# Patient Record
Sex: Female | Born: 1940 | Race: White | Hispanic: No | Marital: Married | Smoking: Never smoker
Health system: Southern US, Community
[De-identification: ages and names within clinical notes are randomized; demographics above are authoritative.]

---

## 2000-05-31 ENCOUNTER — Other Ambulatory Visit: Admission: RE | Admit: 2000-05-31 | Discharge: 2000-05-31 | Payer: Self-pay | Admitting: *Deleted

## 2001-09-20 ENCOUNTER — Encounter: Payer: Self-pay | Admitting: Family Medicine

## 2001-09-20 ENCOUNTER — Ambulatory Visit (HOSPITAL_COMMUNITY): Admission: RE | Admit: 2001-09-20 | Discharge: 2001-09-20 | Payer: Self-pay | Admitting: Family Medicine

## 2004-01-06 ENCOUNTER — Ambulatory Visit (HOSPITAL_COMMUNITY): Admission: RE | Admit: 2004-01-06 | Discharge: 2004-01-06 | Payer: Self-pay | Admitting: Family Medicine

## 2004-01-07 ENCOUNTER — Ambulatory Visit (HOSPITAL_COMMUNITY): Admission: RE | Admit: 2004-01-07 | Discharge: 2004-01-07 | Payer: Self-pay | Admitting: Family Medicine

## 2004-03-11 ENCOUNTER — Ambulatory Visit: Payer: Self-pay | Admitting: Gastroenterology

## 2006-05-09 ENCOUNTER — Ambulatory Visit (HOSPITAL_COMMUNITY): Admission: RE | Admit: 2006-05-09 | Discharge: 2006-05-09 | Payer: Self-pay | Admitting: Family Medicine

## 2006-11-14 ENCOUNTER — Ambulatory Visit (HOSPITAL_COMMUNITY): Admission: RE | Admit: 2006-11-14 | Discharge: 2006-11-14 | Payer: Self-pay | Admitting: Family Medicine

## 2006-11-22 ENCOUNTER — Ambulatory Visit (HOSPITAL_COMMUNITY): Admission: RE | Admit: 2006-11-22 | Discharge: 2006-11-22 | Payer: Self-pay | Admitting: Family Medicine

## 2007-12-05 ENCOUNTER — Ambulatory Visit (HOSPITAL_COMMUNITY): Admission: RE | Admit: 2007-12-05 | Discharge: 2007-12-05 | Payer: Self-pay | Admitting: Family Medicine

## 2008-03-03 ENCOUNTER — Ambulatory Visit (HOSPITAL_COMMUNITY): Admission: RE | Admit: 2008-03-03 | Discharge: 2008-03-03 | Payer: Self-pay | Admitting: Family Medicine

## 2008-03-10 ENCOUNTER — Ambulatory Visit (HOSPITAL_COMMUNITY): Admission: RE | Admit: 2008-03-10 | Discharge: 2008-03-10 | Payer: Self-pay | Admitting: Family Medicine

## 2010-02-24 IMAGING — CT CT HEAD W/O CM
1 series · 16 of 30 positions shown, 20 images · non-contrast
Comparison: Head CT 05/09/2006

CLINICAL DATA: Acute headache, worst of life.

CT HEAD WITHOUT CONTRAST
TECHNIQUE: Contiguous axial images were obtained from the base of
the skull through the vertex without contrast.

[Series 2: headseq 4.8 h37s · axial · 0.43mm/px · z∈[+124,+257]mm · 16 of 31 slices shown, 20 images]
[im 2/31  brain]
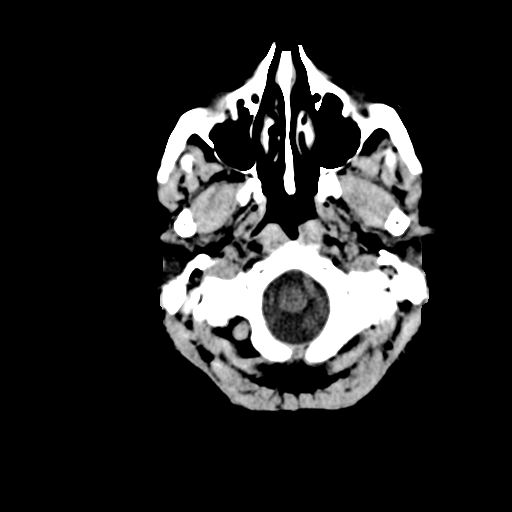
[im 2/31  bone]
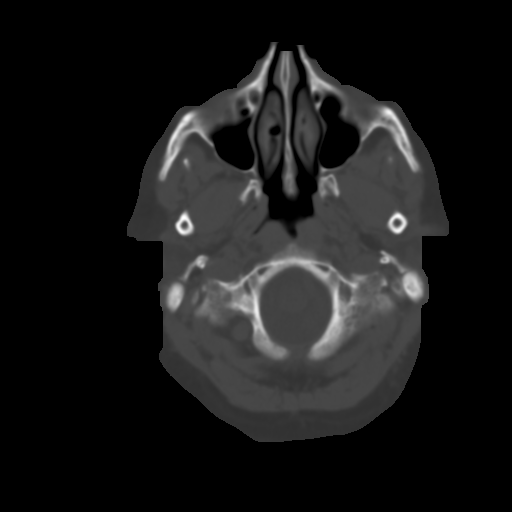
[im 4/31  brain]
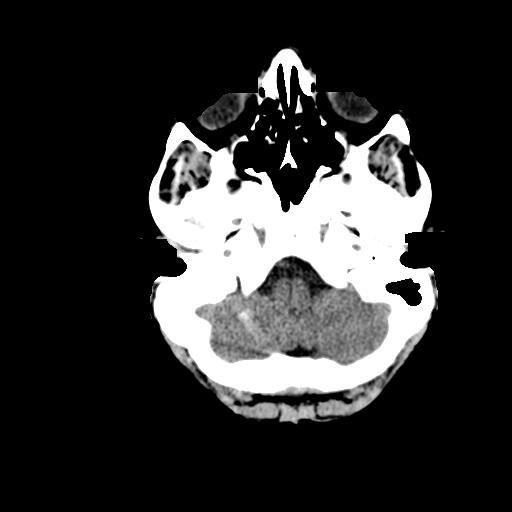
[im 6/31  brain]
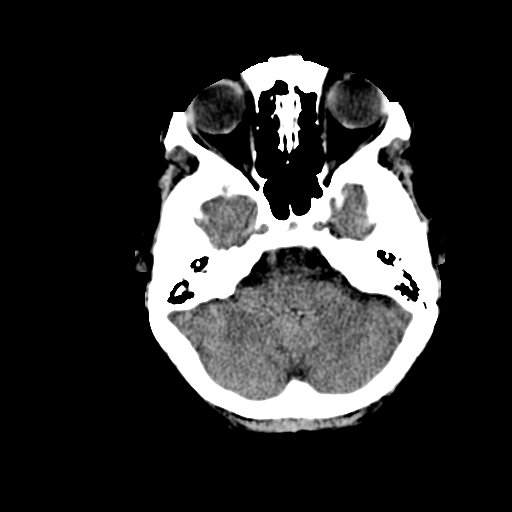
[im 8/31  brain]
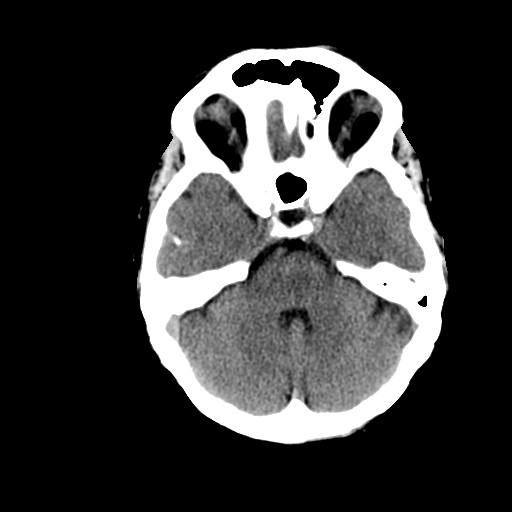
[im 9/31  brain]
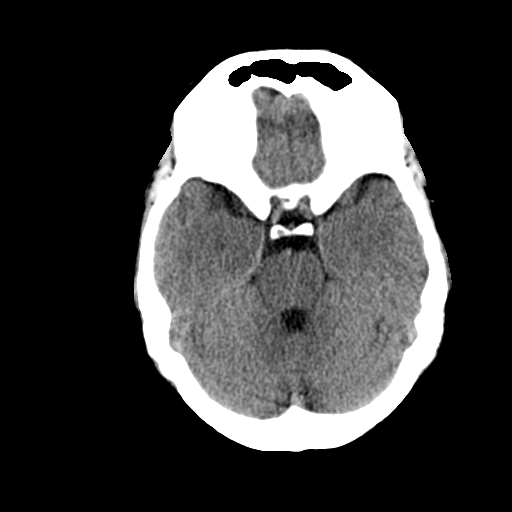
[im 9/31  bone]
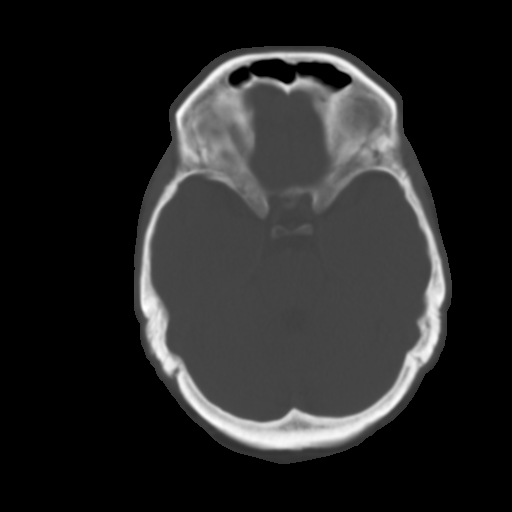
[im 11/31  brain]
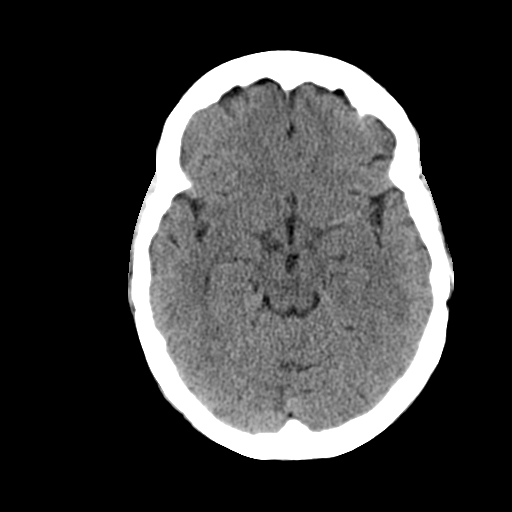
[im 13/31  brain]
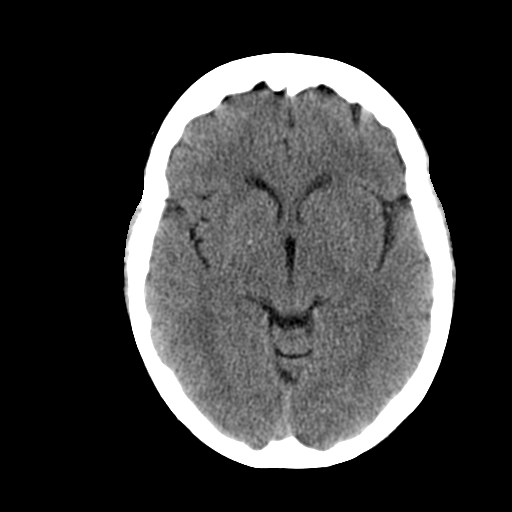
[im 15/31  brain]
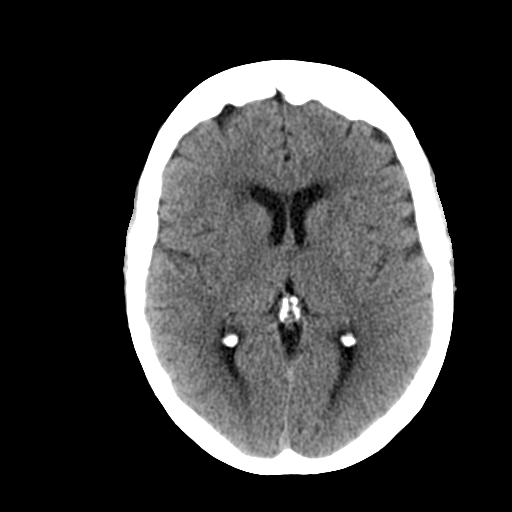
[im 16/31  brain]
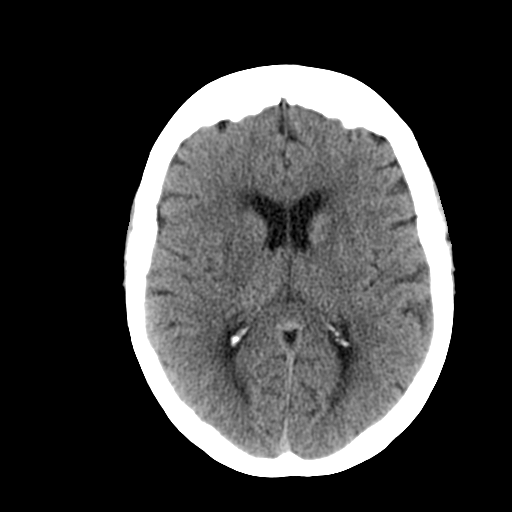
[im 16/31  bone]
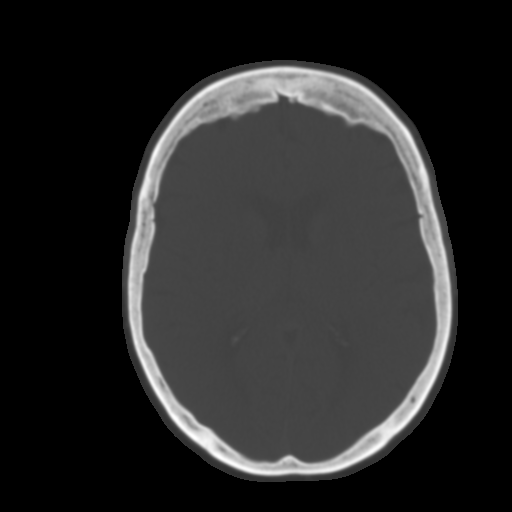
[im 18/31  brain]
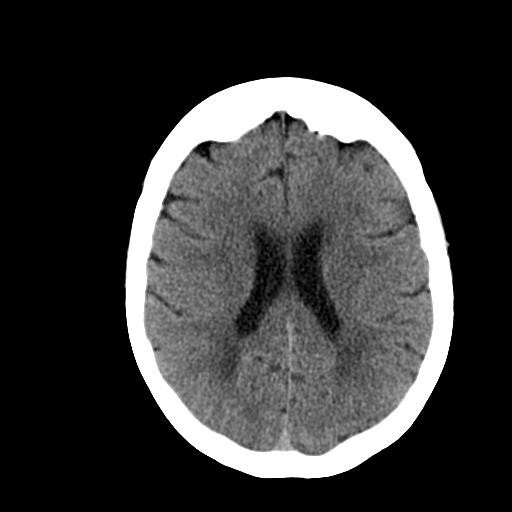
[im 20/31  brain]
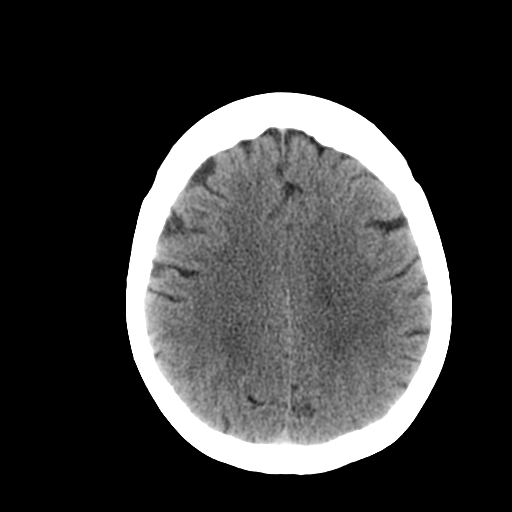
[im 22/31  brain]
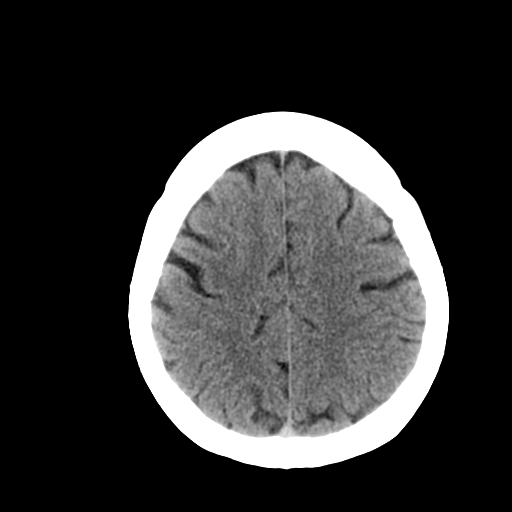
[im 23/31  brain]
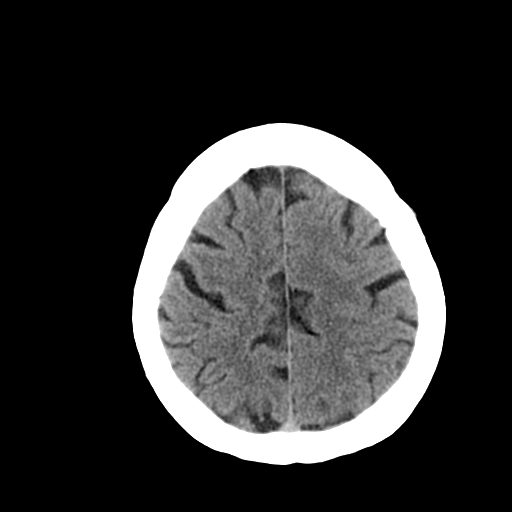
[im 23/31  bone]
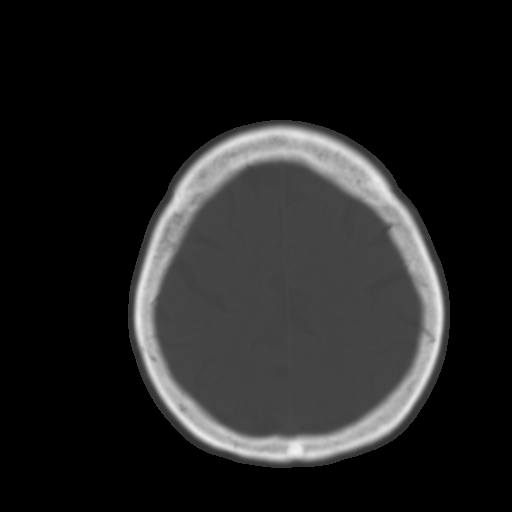
[im 25/31  brain]
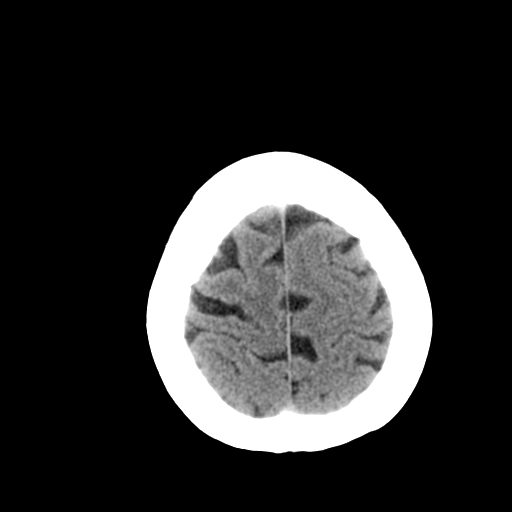
[im 27/31  brain]
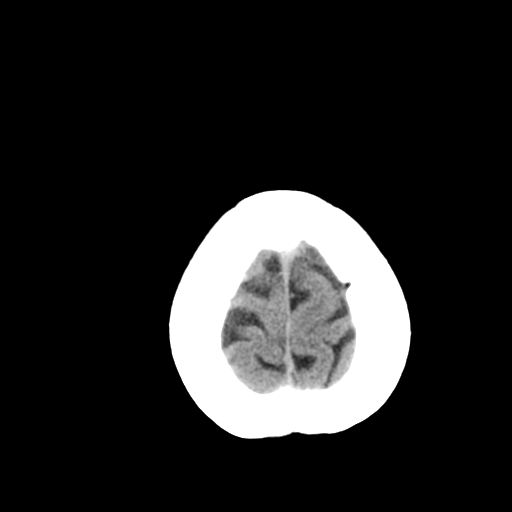
[im 29/31  brain]
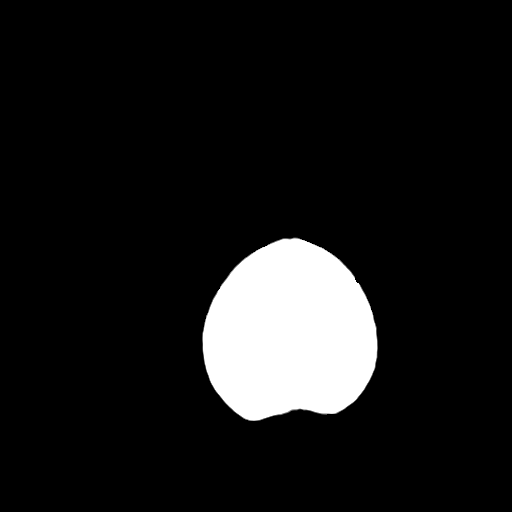

[16 of 30 positions shown; findings below may reference images not displayed]

FINDINGS: No evidence of acute intracranial hemorrhage.  No
hydrocephalus, midline shift or mass effect.  No focal mass lesion.
There is a new subcortical hypodensity in the left frontal lobe
(image 21).  No CT evidence of acute cortical infarction.  There is
mild periventricular and subcortical white matter disease similar
to prior.

Paranasal sinuses mastoid air cells are clear.  Orbits are normal.
IMPRESSION: 1. No evidence of acute intracranial hemorrhage.
2.  New white matter hypodensity in the left frontal lobe.  This
represents an age indeterminate infarction.  No CT evidence of
cortical infarction.  Consider MRI for further evaluation if
concern for acute stroke.

## 2010-10-12 ENCOUNTER — Other Ambulatory Visit (HOSPITAL_COMMUNITY): Payer: Self-pay | Admitting: Internal Medicine

## 2010-10-12 DIAGNOSIS — Z139 Encounter for screening, unspecified: Secondary | ICD-10-CM

## 2010-12-16 ENCOUNTER — Ambulatory Visit (HOSPITAL_COMMUNITY)
Admission: RE | Admit: 2010-12-16 | Discharge: 2010-12-16 | Disposition: A | Discharge status: Home / Self Care | Payer: Medicare Other | Source: Ambulatory Visit | Attending: Internal Medicine | Admitting: Internal Medicine

## 2010-12-16 DIAGNOSIS — Z1231 Encounter for screening mammogram for malignant neoplasm of breast: Secondary | ICD-10-CM | POA: Insufficient documentation

## 2010-12-16 DIAGNOSIS — Z780 Asymptomatic menopausal state: Secondary | ICD-10-CM | POA: Insufficient documentation

## 2010-12-16 DIAGNOSIS — Z139 Encounter for screening, unspecified: Secondary | ICD-10-CM

## 2010-12-16 DIAGNOSIS — M899 Disorder of bone, unspecified: Secondary | ICD-10-CM | POA: Insufficient documentation

## 2012-07-11 ENCOUNTER — Other Ambulatory Visit (HOSPITAL_COMMUNITY): Payer: Self-pay | Admitting: Family Medicine

## 2012-07-11 DIAGNOSIS — Z139 Encounter for screening, unspecified: Secondary | ICD-10-CM

## 2012-07-16 ENCOUNTER — Ambulatory Visit (HOSPITAL_COMMUNITY)
Admission: RE | Admit: 2012-07-16 | Discharge: 2012-07-16 | Disposition: A | Discharge status: Home / Self Care | Payer: Medicare Other | Source: Ambulatory Visit | Attending: Family Medicine | Admitting: Family Medicine

## 2012-07-16 DIAGNOSIS — Z139 Encounter for screening, unspecified: Secondary | ICD-10-CM

## 2012-07-16 DIAGNOSIS — Z1231 Encounter for screening mammogram for malignant neoplasm of breast: Secondary | ICD-10-CM | POA: Insufficient documentation

## 2013-05-03 ENCOUNTER — Ambulatory Visit (INDEPENDENT_AMBULATORY_CARE_PROVIDER_SITE_OTHER): Payer: Commercial Managed Care - HMO | Admitting: Podiatry

## 2013-05-03 ENCOUNTER — Encounter: Payer: Self-pay | Admitting: Podiatry

## 2013-05-03 ENCOUNTER — Ambulatory Visit (INDEPENDENT_AMBULATORY_CARE_PROVIDER_SITE_OTHER): Payer: Commercial Managed Care - HMO

## 2013-05-03 VITALS — BP 124/68 | HR 64 | Resp 16 | Ht 65.0 in | Wt 161.0 lb

## 2013-05-03 DIAGNOSIS — M2040 Other hammer toe(s) (acquired), unspecified foot: Secondary | ICD-10-CM

## 2013-05-03 NOTE — Progress Notes (Signed)
Have a hammertoe on the right foot driving me crazy. 3rd digit right foot

## 2013-05-03 NOTE — Progress Notes (Signed)
Subjective:     Patient ID: Cassandra Carroll, female   DOB: 01-29-1941, 73 y.o.   MRN: [id number]  HPI patient states that she has a very painful hammertoe deformity third right that has been present for about a year and getting gradually worse. She's tried trimming padding shoe gear modifications without relief of symptoms and we had fixed her fourth and fifth toes several years ago and they have done wonderful   Review of Systems     Objective:   Physical Exam Neurovascular status was found to be intact with a very painful distal keratotic lesion digit 3 right and an elevated third toe at the proximal interphalangeal joint causing distal plantar flexion of the toe    Assessment:     Significant hammertoe deformity third right with a distal keratotic lesion deformity that is very painful when pressed with patient having inability to walk comfortably or wear shoe gear    Plan:     Discussed deformity at this time. She has tried trimming padding and shoe gear modifications and desires correction area I have recommended proximal arthroplasty with possible fusion and distal elevating arthroplasty with pin fixation. I explained to her the surgery and at this time allowed her to read a consent form concerning correction. Explained all possible complications that can occur and the fact there is no long-term guarantees as far as success of surgery and that eventually the second toe and big toe may need work because of its pressure that puts against the third toe. Patient understands this and that recovery is about 6 months and is scheduled for outpatient surgery

## 2013-05-10 ENCOUNTER — Ambulatory Visit: Payer: Self-pay | Admitting: Podiatry

## 2013-05-24 ENCOUNTER — Encounter: Payer: Self-pay | Admitting: Podiatry

## 2013-05-24 NOTE — Progress Notes (Signed)
PRECERT# [id number] FOR SURGERY [facility] ON 4.14.15. EXPIRES 7.13.15.

## 2013-05-28 ENCOUNTER — Encounter: Payer: Self-pay | Admitting: Podiatry

## 2013-05-28 DIAGNOSIS — M2040 Other hammer toe(s) (acquired), unspecified foot: Secondary | ICD-10-CM

## 2013-05-31 NOTE — Progress Notes (Signed)
1. Hammer toe repair with removal bone prox and distal 3rd toe (r) with pin

## 2013-06-04 ENCOUNTER — Ambulatory Visit: Payer: Commercial Managed Care - HMO | Admitting: Podiatry

## 2013-06-04 ENCOUNTER — Ambulatory Visit (INDEPENDENT_AMBULATORY_CARE_PROVIDER_SITE_OTHER): Payer: Commercial Managed Care - HMO

## 2013-06-04 ENCOUNTER — Encounter: Payer: Self-pay | Admitting: Podiatry

## 2013-06-04 VITALS — BP 134/74 | HR 62 | Resp 12

## 2013-06-04 DIAGNOSIS — Z9889 Other specified postprocedural states: Secondary | ICD-10-CM

## 2013-06-04 DIAGNOSIS — M79609 Pain in unspecified limb: Secondary | ICD-10-CM

## 2013-06-04 DIAGNOSIS — M2040 Other hammer toe(s) (acquired), unspecified foot: Secondary | ICD-10-CM

## 2013-06-04 NOTE — Progress Notes (Signed)
Subjective:     Patient ID: Cassandra Carroll, female   DOB: [birth year], 73 y.o.   MRN: [id number]  HPI patient presents stating my right foot is doing very well with pain intact and the toe in good alignment one week after surgery   Review of Systems     Objective:   Physical Exam Neurovascular status unchanged with digital position third toe very good with pain intact and wound edges well coapted with sutures intact    Assessment:     Doing very well postoperatively digital procedure right third toe    Plan:     X-ray reviewed and sterile dressing reapplied along with digital splint. Reappoint her recheck

## 2013-06-05 NOTE — Progress Notes (Signed)
Rx:  vicodin 5/325 #20 0 refills one by mouth every 6 hrs as needed for pain.

## 2013-06-14 ENCOUNTER — Ambulatory Visit (INDEPENDENT_AMBULATORY_CARE_PROVIDER_SITE_OTHER): Payer: Commercial Managed Care - HMO | Admitting: *Deleted

## 2013-06-14 VITALS — BP 122/66 | HR 62 | Resp 16

## 2013-06-14 DIAGNOSIS — Z9889 Other specified postprocedural states: Secondary | ICD-10-CM

## 2013-06-14 NOTE — Progress Notes (Signed)
Pt presents for suture removal , sutures looked good, removed sutures and to follow up with dr regal 2 weeks

## 2013-07-02 ENCOUNTER — Ambulatory Visit (INDEPENDENT_AMBULATORY_CARE_PROVIDER_SITE_OTHER): Payer: Commercial Managed Care - HMO

## 2013-07-02 ENCOUNTER — Encounter: Payer: Self-pay | Admitting: Podiatry

## 2013-07-02 ENCOUNTER — Ambulatory Visit (INDEPENDENT_AMBULATORY_CARE_PROVIDER_SITE_OTHER): Payer: Commercial Managed Care - HMO | Admitting: Podiatry

## 2013-07-02 VITALS — BP 122/66 | HR 62 | Resp 16

## 2013-07-02 DIAGNOSIS — M2040 Other hammer toe(s) (acquired), unspecified foot: Secondary | ICD-10-CM

## 2013-07-02 NOTE — Progress Notes (Signed)
Subjective:     Patient ID: Cassandra Carroll, female   DOB: 1940/05/24, 73 y.o.   MRN: [id number]  HPI patient states I'm doing very well with my toe   Review of Systems     Objective:   Physical Exam Neurovascular status intact pin is intact second digit right and the toe is in good alignment with mild edema    Assessment:     Healing well from digital fusion digit for right    Plan:     Reviewed x-rays removed pin and applied sterile dressing. Begin slow return to saw shoe gear in reappoint as needed

## 2014-03-10 ENCOUNTER — Ambulatory Visit: Payer: Self-pay | Admitting: Gastroenterology

## 2014-03-10 DIAGNOSIS — Z800 Family history of malignant neoplasm of digestive organs: Secondary | ICD-10-CM | POA: Diagnosis not present

## 2014-03-10 DIAGNOSIS — Z1211 Encounter for screening for malignant neoplasm of colon: Secondary | ICD-10-CM | POA: Diagnosis not present

## 2014-03-10 DIAGNOSIS — E079 Disorder of thyroid, unspecified: Secondary | ICD-10-CM | POA: Diagnosis not present

## 2014-10-27 DIAGNOSIS — H5210 Myopia, unspecified eye: Secondary | ICD-10-CM | POA: Diagnosis not present

## 2014-10-27 DIAGNOSIS — I10 Essential (primary) hypertension: Secondary | ICD-10-CM | POA: Diagnosis not present

## 2014-10-27 DIAGNOSIS — H2513 Age-related nuclear cataract, bilateral: Secondary | ICD-10-CM | POA: Diagnosis not present

## 2014-10-27 DIAGNOSIS — H5200 Hypermetropia, unspecified eye: Secondary | ICD-10-CM | POA: Diagnosis not present

## 2014-11-21 DIAGNOSIS — Z1389 Encounter for screening for other disorder: Secondary | ICD-10-CM | POA: Diagnosis not present

## 2014-11-21 DIAGNOSIS — Z23 Encounter for immunization: Secondary | ICD-10-CM | POA: Diagnosis not present

## 2014-11-21 DIAGNOSIS — E663 Overweight: Secondary | ICD-10-CM | POA: Diagnosis not present

## 2014-11-21 DIAGNOSIS — Z0001 Encounter for general adult medical examination with abnormal findings: Secondary | ICD-10-CM | POA: Diagnosis not present

## 2014-11-21 DIAGNOSIS — F329 Major depressive disorder, single episode, unspecified: Secondary | ICD-10-CM | POA: Diagnosis not present

## 2014-11-21 DIAGNOSIS — Z6825 Body mass index (BMI) 25.0-25.9, adult: Secondary | ICD-10-CM | POA: Diagnosis not present

## 2014-11-21 DIAGNOSIS — I10 Essential (primary) hypertension: Secondary | ICD-10-CM | POA: Diagnosis not present

## 2014-11-21 DIAGNOSIS — E063 Autoimmune thyroiditis: Secondary | ICD-10-CM | POA: Diagnosis not present

## 2015-06-29 DIAGNOSIS — E669 Obesity, unspecified: Secondary | ICD-10-CM | POA: Diagnosis not present

## 2015-06-29 DIAGNOSIS — E782 Mixed hyperlipidemia: Secondary | ICD-10-CM | POA: Diagnosis not present

## 2015-06-29 DIAGNOSIS — Z1389 Encounter for screening for other disorder: Secondary | ICD-10-CM | POA: Diagnosis not present

## 2015-06-29 DIAGNOSIS — M20011 Mallet finger of right finger(s): Secondary | ICD-10-CM | POA: Diagnosis not present

## 2015-06-29 DIAGNOSIS — Z6825 Body mass index (BMI) 25.0-25.9, adult: Secondary | ICD-10-CM | POA: Diagnosis not present

## 2015-06-29 DIAGNOSIS — E663 Overweight: Secondary | ICD-10-CM | POA: Diagnosis not present

## 2015-06-29 DIAGNOSIS — I10 Essential (primary) hypertension: Secondary | ICD-10-CM | POA: Diagnosis not present

## 2015-07-08 DIAGNOSIS — M20011 Mallet finger of right finger(s): Secondary | ICD-10-CM | POA: Diagnosis not present

## 2015-07-24 DIAGNOSIS — M20011 Mallet finger of right finger(s): Secondary | ICD-10-CM | POA: Diagnosis not present

## 2015-08-10 DIAGNOSIS — M20011 Mallet finger of right finger(s): Secondary | ICD-10-CM | POA: Diagnosis not present

## 2015-09-02 DIAGNOSIS — M20011 Mallet finger of right finger(s): Secondary | ICD-10-CM | POA: Diagnosis not present

## 2015-09-22 DIAGNOSIS — M20011 Mallet finger of right finger(s): Secondary | ICD-10-CM | POA: Diagnosis not present

## 2015-10-02 DIAGNOSIS — M20011 Mallet finger of right finger(s): Secondary | ICD-10-CM | POA: Diagnosis not present

## 2015-10-16 DIAGNOSIS — M20011 Mallet finger of right finger(s): Secondary | ICD-10-CM | POA: Diagnosis not present

## 2015-10-26 DIAGNOSIS — M20011 Mallet finger of right finger(s): Secondary | ICD-10-CM | POA: Diagnosis not present

## 2015-11-04 DIAGNOSIS — M20011 Mallet finger of right finger(s): Secondary | ICD-10-CM | POA: Diagnosis not present

## 2015-11-09 DIAGNOSIS — M20011 Mallet finger of right finger(s): Secondary | ICD-10-CM | POA: Diagnosis not present

## 2015-11-23 DIAGNOSIS — Z0000 Encounter for general adult medical examination without abnormal findings: Secondary | ICD-10-CM | POA: Diagnosis not present

## 2015-11-23 DIAGNOSIS — E663 Overweight: Secondary | ICD-10-CM | POA: Diagnosis not present

## 2015-11-23 DIAGNOSIS — Z1389 Encounter for screening for other disorder: Secondary | ICD-10-CM | POA: Diagnosis not present

## 2015-11-23 DIAGNOSIS — I10 Essential (primary) hypertension: Secondary | ICD-10-CM | POA: Diagnosis not present

## 2015-11-23 DIAGNOSIS — M20011 Mallet finger of right finger(s): Secondary | ICD-10-CM | POA: Diagnosis not present

## 2015-11-23 DIAGNOSIS — E039 Hypothyroidism, unspecified: Secondary | ICD-10-CM | POA: Diagnosis not present

## 2015-11-23 DIAGNOSIS — E782 Mixed hyperlipidemia: Secondary | ICD-10-CM | POA: Diagnosis not present

## 2015-11-23 DIAGNOSIS — Z6827 Body mass index (BMI) 27.0-27.9, adult: Secondary | ICD-10-CM | POA: Diagnosis not present

## 2015-12-07 DIAGNOSIS — M20011 Mallet finger of right finger(s): Secondary | ICD-10-CM | POA: Diagnosis not present

## 2015-12-21 DIAGNOSIS — M20011 Mallet finger of right finger(s): Secondary | ICD-10-CM | POA: Diagnosis not present

## 2016-01-04 DIAGNOSIS — M20011 Mallet finger of right finger(s): Secondary | ICD-10-CM | POA: Diagnosis not present

## 2016-02-01 DIAGNOSIS — M20011 Mallet finger of right finger(s): Secondary | ICD-10-CM | POA: Diagnosis not present

## 2016-02-18 ENCOUNTER — Ambulatory Visit: Payer: Commercial Managed Care - HMO | Admitting: Podiatry

## 2016-03-10 ENCOUNTER — Ambulatory Visit: Payer: Commercial Managed Care - HMO | Admitting: Podiatry

## 2016-03-24 ENCOUNTER — Ambulatory Visit: Payer: Commercial Managed Care - HMO | Admitting: Podiatry

## 2016-03-24 ENCOUNTER — Ambulatory Visit (INDEPENDENT_AMBULATORY_CARE_PROVIDER_SITE_OTHER): Payer: Medicare HMO | Admitting: Podiatry

## 2016-03-24 ENCOUNTER — Ambulatory Visit (INDEPENDENT_AMBULATORY_CARE_PROVIDER_SITE_OTHER): Payer: Medicare HMO

## 2016-03-24 DIAGNOSIS — M2041 Other hammer toe(s) (acquired), right foot: Secondary | ICD-10-CM

## 2016-03-24 NOTE — Patient Instructions (Signed)
Bunionectomy A bunionectomy is a surgical procedure to remove a bunion. A bunion is a visible bump of bone on the inside of your foot where your big toe meets the rest of your foot. A bunion can develop when pressure turns this bone (first metatarsal) toward the other toes. Shoes that are too tight are the most common cause of bunions. Bunions can also be caused by diseases, such as arthritis and polio. You may need a bunionectomy if your bunion is very large and painful or it affects your ability to walk. Tell a health care provider about:  Any allergies you have.  All medicines you are taking, including vitamins, herbs, eye drops, creams, and over-the-counter medicines.  Any problems you or family members have had with anesthetic medicines.  Any blood disorders you have.  Any surgeries you have had.  Any medical conditions you have. What are the risks? Generally, this is a safe procedure. However, problems may occur, including:  Infection.  Pain.  Nerve damage.  Bleeding or blood clots.  Reactions to medicines.  Numbness, stiffness, or arthritis in your toe.  Foot problems that continue even after the procedure. What happens before the procedure?  Ask your health care provider about:  Changing or stopping your regular medicines. This is especially important if you are taking diabetes medicines or blood thinners.  Taking medicines such as aspirin and ibuprofen. These medicines can thin your blood. Do not take these medicines before your procedure if your health care provider instructs you not to.  Do not drink alcohol before the procedure as directed by your health care provider.  Do not use tobacco products, including cigarettes, chewing tobacco, or electronic cigarettes, before the procedure as directed by your health care provider. If you need help quitting, ask your health care provider.  Ask your health care provider what kind of medicine you will be given during  your procedure. A bunionectomy may be done using one of these:  A medicine that numbs the area (local anesthetic).  A medicine that makes you go to sleep (general anesthetic). If you will be given general anesthetic, do not eat or drink anything after midnight on the night before the procedure or as directed by your health care provider. What happens during the procedure?  An IV tube may be inserted into a vein.  You will be given local anesthetic or general anesthetic.  The surgeon will make a cut (incision) over the enlarged area at the first joint of the big toe. The surgeon will remove the bunion.  You may have more than one incision if any of the bones in your big toe need to be moved. A bone itself may need to be cut.  Sometimes the tissues around the big toe may also need to be cut then tightened or loosened to reposition the toe.  Screws or other hardware may be used to keep your foot in thecorrect position.  The incision will be closed with stitches (sutures) and covered with adhesive strips or another type of bandage (dressing). What happens after the procedure?  You may spend some time in a recovery area.  Your blood pressure, heart rate, breathing rate, and blood oxygen level will be monitored often until the medicines you were given have worn off. This information is not intended to replace advice given to you by your health care provider. Make sure you discuss any questions you have with your health care provider. Document Released: 01/14/2005 Document Revised: 07/09/2015 Document Reviewed: 09/18/2013   Elsevier Interactive Patient Education  2017 [place] Toe Hammer toe is a change in the shape (a deformity) of your second, third, or fourth toe. The deformity causes the middle joint of your toe to stay bent. This causes pain, especially when you are wearing shoes. Hammer toe starts gradually. At first, the toe can be straightened. Gradually over time, the  deformity becomes stiff and permanent. Early treatments to keep the toe straight may relieve pain. As the deformity becomes stiff and permanent, surgery may be needed to straighten the toe. What are the causes? Hammer toe is caused by abnormal bending of the toe joint that is closest to your foot. It happens gradually over time. This pulls on the muscles and connections (tendons) of the toe joint, making them weak and stiff. It is often related to wearing shoes that are too short or narrow and do not let your toes straighten. What increases the risk? You may be at greater risk for hammer toe if you:  Are female.  Are older.  Wear shoes that are too small.  Wear high-heeled shoes that pinch your toes.  Are a Engineer, mining.  Have a second toe that is longer than your big toe (first toe).  Injure your foot or toe.  Have arthritis.  Have a family history of hammer toe.  Have a nerve or muscle disorder. What are the signs or symptoms? The main symptoms of this condition are pain and deformity of the toe. The pain is worse when wearing shoes, walking, or running. Other symptoms may include:  Corns or calluses over the bent part of the toe or between the toes.  Redness and a burning feeling on the toe.  An open sore that forms on the top of the toe.  Not being able to straighten the toe. How is this diagnosed? This condition is diagnosed based on your symptoms and a physical exam. During the exam, your health care provider will try to straighten your toe to see how stiff the deformity is. You may also have tests, such as:  A blood test to check for rheumatoid arthritis.  An X-ray to show how severe the deformity is. How is this treated? Treatment for this condition will depend on how stiff the deformity is. Surgery is often needed. However, sometimes a hammer toe can be straightened without surgery. Treatments that do not involve surgery include:  Taping the toe into a  straightened position.  Using pads and cushions to protect the toe (orthotics).  Wearing shoes that provide enough room for the toes.  Doing toe-stretching exercises at home.  Taking an NSAID to reduce pain and swelling. If these treatments do not help or the toe cannot be straightened, surgery is the next option. The most common surgeries used to straighten a hammer toe include:  Arthroplasty. In this procedure, part of the joint is removed, and that allows the toe to straighten.  Fusion. In this procedure, cartilage between the two bones of the joint is taken out and the bones are fused together into one longer bone.  Implantation. In this procedure, part of the bone is removed and replaced with an implant to let the toe move again.  Flexor tendon transfer. In this procedure, the tendons that curl the toes down (flexor tendons) are repositioned. Follow these instructions at home:  Take over-the-counter and prescription medicines only as told by your health care provider.  Do toe straightening and stretching exercises as told by your  health care provider.  Keep all follow-up visits as told by your health care provider. This is important. How is this prevented?  Wear shoes that give your toes enough room and do not cause pain.  Do not wear high-heeled shoes. Contact a health care provider if:  Your pain gets worse.  Your toe becomes red or swollen.  You develop an open sore on your toe. This information is not intended to replace advice given to you by your health care provider. Make sure you discuss any questions you have with your health care provider. Document Released: 01/29/2000 Document Revised: 08/21/2015 Document Reviewed: 05/27/2015 Elsevier Interactive Patient Education  2017 [employer].

## 2016-03-29 DIAGNOSIS — M2041 Other hammer toe(s) (acquired), right foot: Secondary | ICD-10-CM | POA: Insufficient documentation

## 2016-03-29 NOTE — Progress Notes (Signed)
Subjective: 76 year old female presents the office today for concerns of a hammertoe of the right second toe which has become painful to walk. She has tried changing her shoes as well as padding without any significant improvement. She would discuss surgical intervention. Denies any systemic complaints such as fevers, chills, nausea, vomiting. No acute changes since last appointment, and no other complaints at this time.   Objective: AAO x3, NAD DP/PT pulses palpable bilaterally, CRT less than 3 seconds Hammertoe contractures present the right second toe which is semirigid. There is also flexion contracture the DIPJ. There is tenderness along the right second toe on the PIPJ. There is no other areas of tenderness. There is no edema, erythema, increase in warmth. No open lesions or pre-ulcerative lesions.  No pain with calf compression, swelling, warmth, erythema  Assessment: Hammertoe right 2nd toe.   Plan: -All treatment options discussed with the patient including all alternatives, risks, complications.  -X-rays were obtained and reviewed with the patient. Hammertoe is present with the majority of contracture to the right 2nd DIPJ.  -Etiology of symptoms were discussed -I discussed both conservative and surgical treatment options with the patient. At this time she would like surgery. I believe she wound benefit more from a PIPJ arthrodesis and DIPJ arthroplasty with k-wire fixation. She would like to undergo the surgery but not sure of timing. She would likely do it this spring. She will discuss with her husband and she will call to schedule. I will see her back 1-2 weeks prior to surgery for consent.  -Patient encouraged to call the office with any questions, concerns, change in symptoms.   Celesta Gentile, DPM

## 2016-04-05 DIAGNOSIS — J0100 Acute maxillary sinusitis, unspecified: Secondary | ICD-10-CM | POA: Diagnosis not present

## 2016-05-02 DIAGNOSIS — J0100 Acute maxillary sinusitis, unspecified: Secondary | ICD-10-CM | POA: Diagnosis not present

## 2016-05-23 ENCOUNTER — Other Ambulatory Visit (HOSPITAL_COMMUNITY): Payer: Self-pay | Admitting: Family Medicine

## 2016-05-23 DIAGNOSIS — Z1231 Encounter for screening mammogram for malignant neoplasm of breast: Secondary | ICD-10-CM

## 2016-06-03 ENCOUNTER — Ambulatory Visit (HOSPITAL_COMMUNITY): Payer: Self-pay

## 2016-06-06 ENCOUNTER — Ambulatory Visit (HOSPITAL_COMMUNITY)
Admission: RE | Admit: 2016-06-06 | Discharge: 2016-06-06 | Disposition: A | Discharge status: Home / Self Care | Payer: Medicare HMO | Source: Ambulatory Visit | Attending: Family Medicine | Admitting: Family Medicine

## 2016-06-06 DIAGNOSIS — Z1231 Encounter for screening mammogram for malignant neoplasm of breast: Secondary | ICD-10-CM | POA: Diagnosis not present

## 2016-07-15 DIAGNOSIS — H524 Presbyopia: Secondary | ICD-10-CM | POA: Diagnosis not present

## 2016-07-15 DIAGNOSIS — H43393 Other vitreous opacities, bilateral: Secondary | ICD-10-CM | POA: Diagnosis not present

## 2016-07-15 DIAGNOSIS — H25013 Cortical age-related cataract, bilateral: Secondary | ICD-10-CM | POA: Diagnosis not present

## 2016-07-15 DIAGNOSIS — H2513 Age-related nuclear cataract, bilateral: Secondary | ICD-10-CM | POA: Diagnosis not present

## 2016-08-01 DIAGNOSIS — D229 Melanocytic nevi, unspecified: Secondary | ICD-10-CM | POA: Diagnosis not present

## 2016-08-01 DIAGNOSIS — D2261 Melanocytic nevi of right upper limb, including shoulder: Secondary | ICD-10-CM | POA: Diagnosis not present

## 2016-08-01 DIAGNOSIS — D492 Neoplasm of unspecified behavior of bone, soft tissue, and skin: Secondary | ICD-10-CM | POA: Diagnosis not present

## 2016-08-01 DIAGNOSIS — L821 Other seborrheic keratosis: Secondary | ICD-10-CM | POA: Diagnosis not present

## 2017-01-12 DIAGNOSIS — Z0001 Encounter for general adult medical examination with abnormal findings: Secondary | ICD-10-CM | POA: Diagnosis not present

## 2017-01-12 DIAGNOSIS — E063 Autoimmune thyroiditis: Secondary | ICD-10-CM | POA: Diagnosis not present

## 2017-01-12 DIAGNOSIS — I10 Essential (primary) hypertension: Secondary | ICD-10-CM | POA: Diagnosis not present

## 2017-01-12 DIAGNOSIS — R430 Anosmia: Secondary | ICD-10-CM | POA: Diagnosis not present

## 2017-01-12 DIAGNOSIS — Z6827 Body mass index (BMI) 27.0-27.9, adult: Secondary | ICD-10-CM | POA: Diagnosis not present

## 2017-01-12 DIAGNOSIS — E663 Overweight: Secondary | ICD-10-CM | POA: Diagnosis not present

## 2017-01-12 DIAGNOSIS — E782 Mixed hyperlipidemia: Secondary | ICD-10-CM | POA: Diagnosis not present

## 2017-01-12 DIAGNOSIS — Z1389 Encounter for screening for other disorder: Secondary | ICD-10-CM | POA: Diagnosis not present

## 2017-01-12 DIAGNOSIS — E039 Hypothyroidism, unspecified: Secondary | ICD-10-CM | POA: Diagnosis not present

## 2017-04-10 DIAGNOSIS — H5203 Hypermetropia, bilateral: Secondary | ICD-10-CM | POA: Diagnosis not present

## 2017-04-10 DIAGNOSIS — H2513 Age-related nuclear cataract, bilateral: Secondary | ICD-10-CM | POA: Diagnosis not present

## 2017-04-10 DIAGNOSIS — H52223 Regular astigmatism, bilateral: Secondary | ICD-10-CM | POA: Diagnosis not present

## 2017-04-10 DIAGNOSIS — H25013 Cortical age-related cataract, bilateral: Secondary | ICD-10-CM | POA: Diagnosis not present

## 2017-04-10 DIAGNOSIS — H524 Presbyopia: Secondary | ICD-10-CM | POA: Diagnosis not present

## 2017-05-12 DIAGNOSIS — Z6827 Body mass index (BMI) 27.0-27.9, adult: Secondary | ICD-10-CM | POA: Diagnosis not present

## 2017-05-12 DIAGNOSIS — E7849 Other hyperlipidemia: Secondary | ICD-10-CM | POA: Diagnosis not present

## 2017-05-12 DIAGNOSIS — Z1389 Encounter for screening for other disorder: Secondary | ICD-10-CM | POA: Diagnosis not present

## 2017-05-12 DIAGNOSIS — E663 Overweight: Secondary | ICD-10-CM | POA: Diagnosis not present

## 2017-05-12 DIAGNOSIS — E782 Mixed hyperlipidemia: Secondary | ICD-10-CM | POA: Diagnosis not present

## 2017-12-07 DIAGNOSIS — I10 Essential (primary) hypertension: Secondary | ICD-10-CM | POA: Diagnosis not present

## 2017-12-07 DIAGNOSIS — Z6825 Body mass index (BMI) 25.0-25.9, adult: Secondary | ICD-10-CM | POA: Diagnosis not present

## 2017-12-07 DIAGNOSIS — Z1389 Encounter for screening for other disorder: Secondary | ICD-10-CM | POA: Diagnosis not present

## 2017-12-07 DIAGNOSIS — F329 Major depressive disorder, single episode, unspecified: Secondary | ICD-10-CM | POA: Diagnosis not present

## 2017-12-07 DIAGNOSIS — E039 Hypothyroidism, unspecified: Secondary | ICD-10-CM | POA: Diagnosis not present

## 2017-12-07 DIAGNOSIS — E782 Mixed hyperlipidemia: Secondary | ICD-10-CM | POA: Diagnosis not present

## 2017-12-07 DIAGNOSIS — Z0001 Encounter for general adult medical examination with abnormal findings: Secondary | ICD-10-CM | POA: Diagnosis not present

## 2018-11-30 DIAGNOSIS — E7849 Other hyperlipidemia: Secondary | ICD-10-CM | POA: Diagnosis not present

## 2018-11-30 DIAGNOSIS — Z23 Encounter for immunization: Secondary | ICD-10-CM | POA: Diagnosis not present

## 2018-11-30 DIAGNOSIS — Z0001 Encounter for general adult medical examination with abnormal findings: Secondary | ICD-10-CM | POA: Diagnosis not present

## 2018-11-30 DIAGNOSIS — Z1389 Encounter for screening for other disorder: Secondary | ICD-10-CM | POA: Diagnosis not present

## 2018-11-30 DIAGNOSIS — E063 Autoimmune thyroiditis: Secondary | ICD-10-CM | POA: Diagnosis not present

## 2018-11-30 DIAGNOSIS — I10 Essential (primary) hypertension: Secondary | ICD-10-CM | POA: Diagnosis not present

## 2018-11-30 DIAGNOSIS — E663 Overweight: Secondary | ICD-10-CM | POA: Diagnosis not present

## 2018-11-30 DIAGNOSIS — Z6827 Body mass index (BMI) 27.0-27.9, adult: Secondary | ICD-10-CM | POA: Diagnosis not present

## 2018-11-30 DIAGNOSIS — E039 Hypothyroidism, unspecified: Secondary | ICD-10-CM | POA: Diagnosis not present

## 2018-12-18 DIAGNOSIS — I781 Nevus, non-neoplastic: Secondary | ICD-10-CM | POA: Diagnosis not present

## 2019-09-05 DIAGNOSIS — M9903 Segmental and somatic dysfunction of lumbar region: Secondary | ICD-10-CM | POA: Diagnosis not present

## 2019-09-05 DIAGNOSIS — M5137 Other intervertebral disc degeneration, lumbosacral region: Secondary | ICD-10-CM | POA: Diagnosis not present

## 2019-09-05 DIAGNOSIS — M9901 Segmental and somatic dysfunction of cervical region: Secondary | ICD-10-CM | POA: Diagnosis not present

## 2019-09-05 DIAGNOSIS — M545 Low back pain: Secondary | ICD-10-CM | POA: Diagnosis not present

## 2019-09-06 DIAGNOSIS — M9903 Segmental and somatic dysfunction of lumbar region: Secondary | ICD-10-CM | POA: Diagnosis not present

## 2019-09-06 DIAGNOSIS — M545 Low back pain: Secondary | ICD-10-CM | POA: Diagnosis not present

## 2019-09-06 DIAGNOSIS — M9901 Segmental and somatic dysfunction of cervical region: Secondary | ICD-10-CM | POA: Diagnosis not present

## 2019-09-06 DIAGNOSIS — M5137 Other intervertebral disc degeneration, lumbosacral region: Secondary | ICD-10-CM | POA: Diagnosis not present

## 2019-09-09 DIAGNOSIS — M9901 Segmental and somatic dysfunction of cervical region: Secondary | ICD-10-CM | POA: Diagnosis not present

## 2019-09-09 DIAGNOSIS — M545 Low back pain: Secondary | ICD-10-CM | POA: Diagnosis not present

## 2019-09-09 DIAGNOSIS — M9903 Segmental and somatic dysfunction of lumbar region: Secondary | ICD-10-CM | POA: Diagnosis not present

## 2019-09-09 DIAGNOSIS — M5137 Other intervertebral disc degeneration, lumbosacral region: Secondary | ICD-10-CM | POA: Diagnosis not present

## 2019-09-10 DIAGNOSIS — M9903 Segmental and somatic dysfunction of lumbar region: Secondary | ICD-10-CM | POA: Diagnosis not present

## 2019-09-10 DIAGNOSIS — M5137 Other intervertebral disc degeneration, lumbosacral region: Secondary | ICD-10-CM | POA: Diagnosis not present

## 2019-09-10 DIAGNOSIS — M545 Low back pain: Secondary | ICD-10-CM | POA: Diagnosis not present

## 2019-09-10 DIAGNOSIS — M9901 Segmental and somatic dysfunction of cervical region: Secondary | ICD-10-CM | POA: Diagnosis not present

## 2019-09-13 DIAGNOSIS — M545 Low back pain: Secondary | ICD-10-CM | POA: Diagnosis not present

## 2019-09-13 DIAGNOSIS — M5137 Other intervertebral disc degeneration, lumbosacral region: Secondary | ICD-10-CM | POA: Diagnosis not present

## 2019-09-13 DIAGNOSIS — M9903 Segmental and somatic dysfunction of lumbar region: Secondary | ICD-10-CM | POA: Diagnosis not present

## 2019-09-13 DIAGNOSIS — M9901 Segmental and somatic dysfunction of cervical region: Secondary | ICD-10-CM | POA: Diagnosis not present

## 2019-09-16 DIAGNOSIS — M5137 Other intervertebral disc degeneration, lumbosacral region: Secondary | ICD-10-CM | POA: Diagnosis not present

## 2019-09-16 DIAGNOSIS — M9901 Segmental and somatic dysfunction of cervical region: Secondary | ICD-10-CM | POA: Diagnosis not present

## 2019-09-16 DIAGNOSIS — M9903 Segmental and somatic dysfunction of lumbar region: Secondary | ICD-10-CM | POA: Diagnosis not present

## 2019-09-16 DIAGNOSIS — M545 Low back pain: Secondary | ICD-10-CM | POA: Diagnosis not present

## 2019-09-18 DIAGNOSIS — M9901 Segmental and somatic dysfunction of cervical region: Secondary | ICD-10-CM | POA: Diagnosis not present

## 2019-09-18 DIAGNOSIS — M9903 Segmental and somatic dysfunction of lumbar region: Secondary | ICD-10-CM | POA: Diagnosis not present

## 2019-09-18 DIAGNOSIS — M545 Low back pain: Secondary | ICD-10-CM | POA: Diagnosis not present

## 2019-09-18 DIAGNOSIS — M5137 Other intervertebral disc degeneration, lumbosacral region: Secondary | ICD-10-CM | POA: Diagnosis not present

## 2019-09-20 DIAGNOSIS — M545 Low back pain: Secondary | ICD-10-CM | POA: Diagnosis not present

## 2019-09-20 DIAGNOSIS — M5137 Other intervertebral disc degeneration, lumbosacral region: Secondary | ICD-10-CM | POA: Diagnosis not present

## 2019-09-20 DIAGNOSIS — M9901 Segmental and somatic dysfunction of cervical region: Secondary | ICD-10-CM | POA: Diagnosis not present

## 2019-09-20 DIAGNOSIS — M9903 Segmental and somatic dysfunction of lumbar region: Secondary | ICD-10-CM | POA: Diagnosis not present

## 2019-09-23 DIAGNOSIS — M5137 Other intervertebral disc degeneration, lumbosacral region: Secondary | ICD-10-CM | POA: Diagnosis not present

## 2019-09-23 DIAGNOSIS — M9903 Segmental and somatic dysfunction of lumbar region: Secondary | ICD-10-CM | POA: Diagnosis not present

## 2019-09-23 DIAGNOSIS — M545 Low back pain: Secondary | ICD-10-CM | POA: Diagnosis not present

## 2019-09-23 DIAGNOSIS — M9901 Segmental and somatic dysfunction of cervical region: Secondary | ICD-10-CM | POA: Diagnosis not present

## 2019-11-08 DIAGNOSIS — Z0000 Encounter for general adult medical examination without abnormal findings: Secondary | ICD-10-CM | POA: Diagnosis not present

## 2019-11-08 DIAGNOSIS — E7849 Other hyperlipidemia: Secondary | ICD-10-CM | POA: Diagnosis not present

## 2019-11-08 DIAGNOSIS — E039 Hypothyroidism, unspecified: Secondary | ICD-10-CM | POA: Diagnosis not present

## 2019-11-08 DIAGNOSIS — Z1389 Encounter for screening for other disorder: Secondary | ICD-10-CM | POA: Diagnosis not present

## 2019-11-08 DIAGNOSIS — Z6827 Body mass index (BMI) 27.0-27.9, adult: Secondary | ICD-10-CM | POA: Diagnosis not present

## 2019-11-08 DIAGNOSIS — I10 Essential (primary) hypertension: Secondary | ICD-10-CM | POA: Diagnosis not present

## 2019-11-08 DIAGNOSIS — F329 Major depressive disorder, single episode, unspecified: Secondary | ICD-10-CM | POA: Diagnosis not present

## 2019-11-08 DIAGNOSIS — E782 Mixed hyperlipidemia: Secondary | ICD-10-CM | POA: Diagnosis not present

## 2019-11-08 DIAGNOSIS — K219 Gastro-esophageal reflux disease without esophagitis: Secondary | ICD-10-CM | POA: Diagnosis not present

## 2020-02-26 DIAGNOSIS — Z03818 Encounter for observation for suspected exposure to other biological agents ruled out: Secondary | ICD-10-CM | POA: Diagnosis not present

## 2020-02-26 DIAGNOSIS — Z20822 Contact with and (suspected) exposure to covid-19: Secondary | ICD-10-CM | POA: Diagnosis not present

## 2020-03-06 DIAGNOSIS — U071 COVID-19: Secondary | ICD-10-CM | POA: Diagnosis not present

## 2020-05-20 DIAGNOSIS — H5203 Hypermetropia, bilateral: Secondary | ICD-10-CM | POA: Diagnosis not present

## 2020-07-14 DIAGNOSIS — Z0100 Encounter for examination of eyes and vision without abnormal findings: Secondary | ICD-10-CM | POA: Diagnosis not present

## 2020-08-24 DIAGNOSIS — Z20822 Contact with and (suspected) exposure to covid-19: Secondary | ICD-10-CM | POA: Diagnosis not present

## 2020-08-24 DIAGNOSIS — Z03818 Encounter for observation for suspected exposure to other biological agents ruled out: Secondary | ICD-10-CM | POA: Diagnosis not present

## 2020-08-27 DIAGNOSIS — Z20822 Contact with and (suspected) exposure to covid-19: Secondary | ICD-10-CM | POA: Diagnosis not present

## 2020-08-27 DIAGNOSIS — Z03818 Encounter for observation for suspected exposure to other biological agents ruled out: Secondary | ICD-10-CM | POA: Diagnosis not present

## 2020-08-31 DIAGNOSIS — R058 Other specified cough: Secondary | ICD-10-CM | POA: Diagnosis not present

## 2020-08-31 DIAGNOSIS — J019 Acute sinusitis, unspecified: Secondary | ICD-10-CM | POA: Diagnosis not present

## 2020-09-22 DIAGNOSIS — F418 Other specified anxiety disorders: Secondary | ICD-10-CM | POA: Diagnosis not present

## 2020-09-22 DIAGNOSIS — Z681 Body mass index (BMI) 19 or less, adult: Secondary | ICD-10-CM | POA: Diagnosis not present

## 2020-09-25 DIAGNOSIS — Z20822 Contact with and (suspected) exposure to covid-19: Secondary | ICD-10-CM | POA: Diagnosis not present

## 2020-09-25 DIAGNOSIS — Z03818 Encounter for observation for suspected exposure to other biological agents ruled out: Secondary | ICD-10-CM | POA: Diagnosis not present

## 2020-11-19 DIAGNOSIS — Z23 Encounter for immunization: Secondary | ICD-10-CM | POA: Diagnosis not present

## 2020-11-19 DIAGNOSIS — Z13220 Encounter for screening for lipoid disorders: Secondary | ICD-10-CM | POA: Diagnosis not present

## 2020-11-19 DIAGNOSIS — Z6825 Body mass index (BMI) 25.0-25.9, adult: Secondary | ICD-10-CM | POA: Diagnosis not present

## 2020-11-19 DIAGNOSIS — Z1331 Encounter for screening for depression: Secondary | ICD-10-CM | POA: Diagnosis not present

## 2020-11-19 DIAGNOSIS — Z13820 Encounter for screening for osteoporosis: Secondary | ICD-10-CM | POA: Diagnosis not present

## 2020-11-19 DIAGNOSIS — E663 Overweight: Secondary | ICD-10-CM | POA: Diagnosis not present

## 2020-11-19 DIAGNOSIS — Z0001 Encounter for general adult medical examination with abnormal findings: Secondary | ICD-10-CM | POA: Diagnosis not present

## 2020-11-19 DIAGNOSIS — Z0000 Encounter for general adult medical examination without abnormal findings: Secondary | ICD-10-CM | POA: Diagnosis not present

## 2020-11-19 DIAGNOSIS — E782 Mixed hyperlipidemia: Secondary | ICD-10-CM | POA: Diagnosis not present

## 2021-04-22 DIAGNOSIS — H25013 Cortical age-related cataract, bilateral: Secondary | ICD-10-CM | POA: Diagnosis not present

## 2021-04-22 DIAGNOSIS — H2513 Age-related nuclear cataract, bilateral: Secondary | ICD-10-CM | POA: Diagnosis not present

## 2021-04-22 DIAGNOSIS — H52223 Regular astigmatism, bilateral: Secondary | ICD-10-CM | POA: Diagnosis not present

## 2021-04-22 DIAGNOSIS — H5203 Hypermetropia, bilateral: Secondary | ICD-10-CM | POA: Diagnosis not present

## 2021-04-22 DIAGNOSIS — H524 Presbyopia: Secondary | ICD-10-CM | POA: Diagnosis not present

## 2021-05-03 DIAGNOSIS — H2513 Age-related nuclear cataract, bilateral: Secondary | ICD-10-CM | POA: Diagnosis not present

## 2021-05-03 DIAGNOSIS — H2512 Age-related nuclear cataract, left eye: Secondary | ICD-10-CM | POA: Diagnosis not present

## 2021-07-01 DIAGNOSIS — Z961 Presence of intraocular lens: Secondary | ICD-10-CM | POA: Diagnosis not present

## 2021-07-01 DIAGNOSIS — H52202 Unspecified astigmatism, left eye: Secondary | ICD-10-CM | POA: Diagnosis not present

## 2021-07-01 DIAGNOSIS — H2512 Age-related nuclear cataract, left eye: Secondary | ICD-10-CM | POA: Diagnosis not present

## 2021-07-02 DIAGNOSIS — H2511 Age-related nuclear cataract, right eye: Secondary | ICD-10-CM | POA: Diagnosis not present

## 2021-07-15 DIAGNOSIS — H2511 Age-related nuclear cataract, right eye: Secondary | ICD-10-CM | POA: Diagnosis not present

## 2021-07-15 DIAGNOSIS — Z961 Presence of intraocular lens: Secondary | ICD-10-CM | POA: Diagnosis not present

## 2021-08-03 DIAGNOSIS — E785 Hyperlipidemia, unspecified: Secondary | ICD-10-CM | POA: Diagnosis not present

## 2021-08-03 DIAGNOSIS — E039 Hypothyroidism, unspecified: Secondary | ICD-10-CM | POA: Diagnosis not present

## 2021-08-03 DIAGNOSIS — K219 Gastro-esophageal reflux disease without esophagitis: Secondary | ICD-10-CM | POA: Diagnosis not present

## 2021-08-03 DIAGNOSIS — Z1389 Encounter for screening for other disorder: Secondary | ICD-10-CM | POA: Diagnosis not present

## 2021-08-03 DIAGNOSIS — Z0000 Encounter for general adult medical examination without abnormal findings: Secondary | ICD-10-CM | POA: Diagnosis not present

## 2021-08-03 DIAGNOSIS — Z780 Asymptomatic menopausal state: Secondary | ICD-10-CM | POA: Diagnosis not present

## 2021-08-03 DIAGNOSIS — I10 Essential (primary) hypertension: Secondary | ICD-10-CM | POA: Diagnosis not present

## 2021-08-03 DIAGNOSIS — F32A Depression, unspecified: Secondary | ICD-10-CM | POA: Diagnosis not present

## 2021-08-10 DIAGNOSIS — M810 Age-related osteoporosis without current pathological fracture: Secondary | ICD-10-CM | POA: Diagnosis not present

## 2021-12-07 DIAGNOSIS — Z23 Encounter for immunization: Secondary | ICD-10-CM | POA: Diagnosis not present

## 2021-12-07 DIAGNOSIS — K219 Gastro-esophageal reflux disease without esophagitis: Secondary | ICD-10-CM | POA: Diagnosis not present

## 2021-12-07 DIAGNOSIS — E785 Hyperlipidemia, unspecified: Secondary | ICD-10-CM | POA: Diagnosis not present

## 2021-12-07 DIAGNOSIS — I10 Essential (primary) hypertension: Secondary | ICD-10-CM | POA: Diagnosis not present

## 2021-12-07 DIAGNOSIS — F32A Depression, unspecified: Secondary | ICD-10-CM | POA: Diagnosis not present

## 2022-01-17 DIAGNOSIS — E7849 Other hyperlipidemia: Secondary | ICD-10-CM | POA: Diagnosis not present

## 2022-01-17 DIAGNOSIS — K219 Gastro-esophageal reflux disease without esophagitis: Secondary | ICD-10-CM | POA: Diagnosis not present

## 2022-01-17 DIAGNOSIS — I10 Essential (primary) hypertension: Secondary | ICD-10-CM | POA: Diagnosis not present

## 2022-01-17 DIAGNOSIS — E039 Hypothyroidism, unspecified: Secondary | ICD-10-CM | POA: Diagnosis not present

## 2022-02-14 NOTE — Progress Notes (Signed)
 Cassandra Carroll is seen for hypertension, hyperlipidemia, hypothyroidism, depression/anxiety.  Overall doing well; some increased anxiety through the holiday season but she feels like she is doing well now.  Has not checked her blood pressure but it is well-controlled here.  She denies chest pain, dyspnea, palpitations.  No hair, skin changes are reported.  Stomach symptoms improved with the use of Carafate transiently and the change over to pantoprazole.  No bowel or bladder issues.  Remains on cholesterol medication without issue    Current Outpatient Medications  Medication Sig Dispense Refill   alendronate (FOSAMAX) 70 MG tablet Take 1 tablet (70 mg total) by mouth every 7 (seven) days Take with a full glass of water. Do not lie down for the next 30 min. 4 tablet 11   ARIPiprazole (ABILIFY) 2 MG tablet Take 1 tablet (2 mg total) by mouth once daily 30 tablet 11   citalopram (CELEXA) 20 MG tablet Take 1 tablet (20 mg total) by mouth once daily 90 tablet 3   COQ10, UBIQUINOL, ORAL Take by mouth once daily.     hydroCHLOROthiazide (HYDRODIURIL) 12.5 MG tablet Take 1 tablet (12.5 mg total) by mouth once daily 90 tablet 3   L.ACID/L.CASEI/B.BIF/B.LON/FOS (PROBIOTIC BLEND ORAL) Take by mouth once daily     levothyroxine (SYNTHROID) 88 MCG tablet Take 1 tablet (88 mcg total) by mouth once daily 90 tablet 3   losartan (COZAAR) 100 MG tablet Take 1 tablet (100 mg total) by mouth once daily 90 tablet 3   MULTIVIT-MIN/FA/LYCOPENE/LUT (SENTRY SENIOR ORAL) Take by mouth once daily.     pantoprazole (PROTONIX) 40 MG DR tablet Take 1 tablet (40 mg total) by mouth once daily 30 tablet 11   simvastatin (ZOCOR) 20 MG tablet Take 1 tablet (20 mg total) by mouth at bedtime 90 tablet 3   No current facility-administered medications for this visit.    Allergies as of 02/15/2022   (No Known Allergies)    Past Medical History:  Diagnosis Date   Benign hypertension    Depression    GERD  (gastroesophageal reflux disease)    Hyperlipidemia    Hypothyroidism     Past Surgical History:  Procedure Laterality Date   FUSION ARTHRODESIS ANKLE W/ARTHROSCOPY Right 1965   COLONOSCOPY  03/11/2004   entire examined colon was normal   COLONOSCOPY  03/10/2014   Normal/No Repeat/PYO   PHOTOREFRACTIVE KERATOTOMY/LASIK Bilateral     Health Maintenance Topics with due status: Overdue     Topic Date Due   Adult Tetanus (Td And Tdap) Never done   Shingrix Never done   Pneumococcal Vaccine: 65+ Never done    Family History  Problem Relation Age of Onset   Stroke Mother    Ulcers Father    Lung cancer Brother     Social History   Socioeconomic History   Marital status: Widowed  Tobacco Use   Smoking status: Former    Types: Cigarettes   Smokeless tobacco: Never  Substance and Sexual Activity   Alcohol  use: Yes   Drug use: Never   Sexual activity: Defer     Goals Addressed             This Visit's Progress    Maintain health/healthy lifestyle   On track       BP 118/70   Pulse 83   Ht 163.8 cm (5' 4.5)   Wt 73 kg (161 lb)   SpO2 98%   BMI 27.21 kg/m   General. Well-developed,  well-nourished patient, in no distress   Lungs. Clear to auscultation bilaterally without wheeze or retractions. Cardiovascular.Regular rate and rhythm without murmurs, gallops, or rubs.  Carotid and radial pulses 2+.   Abdomen. Soft; non tender; non distended; normoactive bowel sounds; no masses or organomegaly Extremities.No clubbing, cyanosis, or edema.  Arthritis changes without synovitis. Neuro: Mildly antalgic gait initially   Benign hypertension  (primary encounter diagnosis) Acquired hypothyroidism Other hyperlipidemia Age-related osteoporosis without current pathological fracture Recurrent major depressive disorder, in full remission (CMS-HCC) Gastroesophageal reflux disease without esophagitis  Assessment and Plan  1.  Anxiety/depression.   Stable on current regimen.   2.  Dyspepsia.  Significant improvement.  Continue pantoprazole, and can wean this as able.  Watch diet.  Long-term risks of proton pump inhibitors have been addressed. 3.  Hyperlipidemia.  Will be controlled but this has risen; she will continue lifestyle efforts and continue current treatment.  Follow liver, lipids periodically. 4.  Hypertension-good control on current treatment.  Limit salt; encouraged diet, exercise.  Follow met b, u/a, tsh periodically  5.  Question of memory changes.  No new issues. 6.  Hyperglycemia. Must watch diet, activity. Monitor met B, A1c, urinalysis periodically. Treatment guidelines are reviewed  7.  Health maintenance.  Reviewed vaccines.  Tetanus shot today.  Annual exam in 6 months, returning sooner if needed.  BERT JACK KLEIN III, MD  Portions of this note were created using dictation software and may contain typographical errors.

## 2022-02-15 DIAGNOSIS — R739 Hyperglycemia, unspecified: Secondary | ICD-10-CM | POA: Diagnosis not present

## 2022-02-15 DIAGNOSIS — Z23 Encounter for immunization: Secondary | ICD-10-CM | POA: Diagnosis not present

## 2022-02-15 DIAGNOSIS — I10 Essential (primary) hypertension: Secondary | ICD-10-CM | POA: Diagnosis not present

## 2022-02-15 DIAGNOSIS — F32A Depression, unspecified: Secondary | ICD-10-CM | POA: Diagnosis not present

## 2022-02-15 DIAGNOSIS — E785 Hyperlipidemia, unspecified: Secondary | ICD-10-CM | POA: Diagnosis not present

## 2023-04-04 DIAGNOSIS — E7849 Other hyperlipidemia: Secondary | ICD-10-CM | POA: Diagnosis not present

## 2023-04-04 DIAGNOSIS — M810 Age-related osteoporosis without current pathological fracture: Secondary | ICD-10-CM | POA: Diagnosis not present

## 2023-04-04 DIAGNOSIS — R7303 Prediabetes: Secondary | ICD-10-CM | POA: Diagnosis not present

## 2023-04-04 DIAGNOSIS — E785 Hyperlipidemia, unspecified: Secondary | ICD-10-CM | POA: Diagnosis not present

## 2023-04-04 DIAGNOSIS — Z1331 Encounter for screening for depression: Secondary | ICD-10-CM | POA: Diagnosis not present

## 2023-04-04 DIAGNOSIS — K219 Gastro-esophageal reflux disease without esophagitis: Secondary | ICD-10-CM | POA: Diagnosis not present

## 2023-04-04 DIAGNOSIS — E039 Hypothyroidism, unspecified: Secondary | ICD-10-CM | POA: Diagnosis not present

## 2023-04-04 DIAGNOSIS — I10 Essential (primary) hypertension: Secondary | ICD-10-CM | POA: Diagnosis not present

## 2023-04-04 DIAGNOSIS — Z0000 Encounter for general adult medical examination without abnormal findings: Secondary | ICD-10-CM | POA: Diagnosis not present

## 2023-04-25 DIAGNOSIS — R413 Other amnesia: Secondary | ICD-10-CM | POA: Diagnosis not present

## 2023-04-25 DIAGNOSIS — F3342 Major depressive disorder, recurrent, in full remission: Secondary | ICD-10-CM | POA: Diagnosis not present

## 2023-04-25 DIAGNOSIS — Z2821 Immunization not carried out because of patient refusal: Secondary | ICD-10-CM | POA: Diagnosis not present

## 2023-04-25 DIAGNOSIS — I10 Essential (primary) hypertension: Secondary | ICD-10-CM | POA: Diagnosis not present

## 2023-04-25 DIAGNOSIS — J029 Acute pharyngitis, unspecified: Secondary | ICD-10-CM | POA: Diagnosis not present

## 2023-04-25 DIAGNOSIS — Z114 Encounter for screening for human immunodeficiency virus [HIV]: Secondary | ICD-10-CM | POA: Diagnosis not present

## 2023-04-27 ENCOUNTER — Other Ambulatory Visit: Payer: Self-pay | Admitting: Internal Medicine

## 2023-04-27 DIAGNOSIS — J069 Acute upper respiratory infection, unspecified: Secondary | ICD-10-CM

## 2023-04-27 DIAGNOSIS — R413 Other amnesia: Secondary | ICD-10-CM

## 2023-05-03 ENCOUNTER — Ambulatory Visit
Admission: RE | Admit: 2023-05-03 | Discharge: 2023-05-03 | Disposition: A | Discharge status: Home / Self Care | Source: Ambulatory Visit | Attending: Internal Medicine | Admitting: Internal Medicine

## 2023-05-03 DIAGNOSIS — R413 Other amnesia: Secondary | ICD-10-CM | POA: Insufficient documentation

## 2023-05-03 DIAGNOSIS — R9082 White matter disease, unspecified: Secondary | ICD-10-CM | POA: Diagnosis not present

## 2023-05-03 DIAGNOSIS — J069 Acute upper respiratory infection, unspecified: Secondary | ICD-10-CM | POA: Diagnosis not present

## 2023-05-09 DIAGNOSIS — E538 Deficiency of other specified B group vitamins: Secondary | ICD-10-CM | POA: Diagnosis not present

## 2023-06-06 DIAGNOSIS — R454 Irritability and anger: Secondary | ICD-10-CM | POA: Diagnosis not present

## 2023-06-06 DIAGNOSIS — G3184 Mild cognitive impairment, so stated: Secondary | ICD-10-CM | POA: Diagnosis not present

## 2023-06-06 DIAGNOSIS — Z789 Other specified health status: Secondary | ICD-10-CM | POA: Diagnosis not present

## 2023-06-06 NOTE — Progress Notes (Signed)
 Today the history is gathered from: 70% - patient  30% - patient's daughter  RECORDS SUMMARY: I have reviewed the note dated 05/30/2023 from Dr. Ophelia Sage who has indicated:  Memory disorder  Given these abnormal neurologic findings, a referral to neurology has been recommended.  REFERRING PHYSICIAN: Sage Ophelia Marinell DOUGLAS, MD PRIMARY CARE PHYSICIAN:  Sage Ophelia Marinell DOUGLAS, MD   IMPRESSION/PLAN  Cassandra Carroll is a 83 y.o. female presenting for evaluation of  MEMORY IMPAIRMENT/ IRRITABILITY - New to me. - Patient here to be evaluated for worsening short term memory concerns over the last 6 months. Reports repeating questions and conversations. Slight irritability. No longer cooking. Continues to drive. Her daughter helps to manage finances.  - Start taking Aricept 5 mg nightly to slow the progression of memory loss.  - Memory evaluation today is 27/30. Will repeat in 6 months.  - Symptoms most consistent with mild cognitive impairment (MCI) at this time. Will continue to monitor at future visits.   Medications previously tried:  Follow-up with Dr. Lane in 6 months.  p=4   CHIEF COMPLAINT & HPI  Cassandra Carroll is a 83 y.o. female presenting for evaluation of: Chief Complaint  Patient presents with   Memory Loss    MEMORY IMPAIRMENT/ IRRITABILITY Patient here to be evaluated for short term memory concerns occurring since the beginning of 2022 and worsening over the last 6 months. No difficulty with long term memory. Reports difficulty recalling words, repeating questions and conversations. Denies misplacing items. Occasional difficulty using cell phone. Denies forgetting familiar names and faces. No longer cooking. She lives by herself. No sundowning or nighttime wandering. Sleep is good. Her daughter notes occasional irritability and frustration. Continues to drive. Her daughter helps to manage finances. Memory evaluation today is 27/30.   DATA SUMMARY: 05/03/2023 CT HEAD  WO IMPRESSION:  1. No acute intracranial abnormality.  2. Mild cerebral volume loss and moderately progressed cerebral  white matter disease since 2010.   03/10/2008 MRI BRAIN W WO IMPRESSION:  No acute or reversible process.  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  This is not acute or subacute however.   03/03/2008 CT HEAD WO IMPRESSION:  1. No evidence of acute intracranial hemorrhage.  2.  New white matter hypodensity in the left frontal lobe.  This  represents an age indeterminate infarction.  No CT evidence of  cortical infarction.  Consider MRI for further evaluation if  concern for acute stroke.   05/09/2006 CT HEAD WO IMPRESSION:  There are some microvascular white matter changes, but no acute infarct or bleed.   VISIT SUMMARIES:   MEDICATIONS Current Outpatient Medications  Medication Sig Dispense Refill   aspirin 81 MG EC tablet Take 81 mg by mouth once daily     citalopram (CELEXA) 20 MG tablet Take 1 tablet (20 mg total) by mouth once daily 90 tablet 3   COQ10, UBIQUINOL, ORAL Take by mouth once daily.     hydroCHLOROthiazide (HYDRODIURIL) 12.5 MG tablet Take 1 tablet (12.5 mg total) by mouth once daily 90 tablet 1   levothyroxine (SYNTHROID) 88 MCG tablet Take 1 tablet by mouth once daily 90 tablet 1   losartan (COZAAR) 100 MG tablet Take 1 tablet by mouth once daily 90 tablet 1   MULTIVIT-MIN/FA/LYCOPENE/LUT (SENTRY SENIOR ORAL) Take by mouth once daily.     pantoprazole (PROTONIX) 40 MG DR  tablet Take 1 tablet by mouth once daily 90 tablet 1   simvastatin (ZOCOR) 20 MG tablet Take 1 tablet (20 mg total) by mouth at bedtime 90 tablet 1   alendronate (FOSAMAX) 70 MG tablet Take 1 tablet (70 mg total) by mouth every 7 (seven) days Take with a full glass of water. Do not lie down for the next 30 min. (Patient not taking: Reported on  06/06/2023) 4 tablet 11   ARIPiprazole (ABILIFY) 2 MG tablet Take 1 tablet (2 mg total) by mouth once daily (Patient not taking: Reported on 06/06/2023) 30 tablet 11   L.ACID/L.CASEI/B.BIF/B.LON/FOS (PROBIOTIC BLEND ORAL) Take by mouth once daily (Patient not taking: Reported on 06/06/2023)     No current facility-administered medications for this visit.    ALLERGIES No Known Allergies   EXAM   Vitals:   06/06/23 0919  BP: 136/68  Pulse: 64  Height: 162.6 cm (5' 4)  PainSc: 0-No pain   Body mass index is 28.15 kg/m.  MEMORY EVALUATION  GENERAL: Pleasant elderly female.  NAD.  Normocephalic and atraumatic.  MUSCULOSKELETAL: Bulk - Normal Tone - Normal Pronator Drift - Absent bilaterally. Ambulation - Gait and station are steady. Romberg - Mildly positive  R/L 5/5    Shoulder abduction (deltoid/supraspinatus, axillary/suprascapular n, C5) 5/5    Elbow flexion (biceps brachii, musculoskeletal n, C5-6) 5/5    Elbow extension (triceps, radial n, C7) 5/5    Finger adduction (interossei, ulnar n, T1)  4/4    Hip flexion (iliopsoas, L1/L2) 4/4    Knee flexion (hamstrings, sciatic n, L5/S1)  4/4    Knee extension (quadriceps, femoral n, L3/4) 4/4    Ankle dorsiflexion (tibialis anterior, deep fibular n, L4/5) 4/4    Ankle plantarflexion (gastroc, tibial n, S1)    NEUROLOGICAL: MENTAL STATUS: Patient is oriented to person, place and time.  Recent memory is mildly reduced.  Remote memory is intact.  Attention span and concentration are intact.  Naming, repetition, comprehension and expressive speech are within normal limits.  Patient's fund of knowledge is within normal limits for educational level.  CRANIAL NERVES: Normal    CN II (normal visual acuity and visual fields) Normal    CN III, IV, VI (extraocular muscles are intact) Normal    CN V (facial sensation is intact bilaterally) Normal    CN VII (facial strength is intact bilaterally) Normal    CN VIII (hearing is  intact bilaterally) Normal    CN IX/X (palate elevates midline, normal phonation) Normal    CN XI (shoulder shrug strength is normal and symmetric) Normal    CN XII (tongue protrudes midline)  SENSATION: Intact to pain and temp bilaterally (spinothalamic tracts) Intact to position and vibration bilaterally (dorsal columns)  REFLEXES: R/L 1+/1+    Biceps 1+/1+    Brachioradialis  1+/1+    Patellar 1+/1+    Achilles   COORDINATION/CEREBELLAR: Finger to nose testing is within normal limits.     PAST MEDICAL HISTORY Past Medical History:  Diagnosis Date   Benign hypertension    Depression    GERD (gastroesophageal reflux disease)    Hyperlipidemia    Hypothyroidism     PAST SURGICAL HISTORY Past Surgical History:  Procedure Laterality Date   FUSION ARTHRODESIS ANKLE W/ARTHROSCOPY Right 1965   COLONOSCOPY  03/11/2004   entire examined colon was normal   COLONOSCOPY  03/10/2014   Normal/No Repeat/PYO   PHOTOREFRACTIVE KERATOTOMY/LASIK Bilateral     FAMILY HISTORY Family History  Problem Relation  Name Age of Onset   Stroke Mother     Ulcers Father     Lung cancer Brother      SOCIAL HISTORY  Social History   Tobacco Use   Smoking status: Former    Types: Cigarettes   Smokeless tobacco: Never  Vaping Use   Vaping status: Never Used  Substance Use Topics   Alcohol  use: Not Currently   Drug use: Never     REVIEW OF SYSTEMS:  13 system ROS form was given to the patient to complete and I have reviewed it.  The form was sent for scan to the patient's EHR.  Pertinent positives and negatives are mentioned above in the HPI and all other systems are negative.   DATA  I have personally reviewed all of the data outlined below both prior to the appointment and during the appointment with the patient as appropriate.  Office Visit on 04/25/2023  Component Date Value Ref Range Status   Vitamin B12 04/25/2023 156 (L)  >300 pg/mL Final   RPR -  [facility] 04/25/2023 Non Reactive  Non Reactive Final   HIV 1/2 Antibodies 04/25/2023 Negative  Negative Final   HIV 1 P24 Antigen 04/25/2023 Negative  Negative Final  Office Visit on 04/04/2023  Component Date Value Ref Range Status   WBC (White Blood Cell Count) 04/04/2023 4.8  4.1 - 10.2 103/uL Final   RBC (Red Blood Cell Count) 04/04/2023 3.83 (L)  4.04 - 5.48 106/uL Final   Hemoglobin 04/04/2023 12.0  12.0 - 15.0 gm/dL Final   Hematocrit 97/81/7974 35.9  35.0 - 47.0 % Final   MCV (Mean Corpuscular Volume) 04/04/2023 93.7  80.0 - 100.0 fl Final   MCH (Mean Corpuscular Hemoglobin) 04/04/2023 31.3 (H)  27.0 - 31.2 pg Final   MCHC (Mean Corpuscular Hemoglobin * 04/04/2023 33.4  32.0 - 36.0 gm/dL Final   Platelet Count 04/04/2023 211  150 - 450 103/uL Final   RDW-CV (Red Cell Distribution Widt* 04/04/2023 12.0  11.6 - 14.8 % Final   MPV (Mean Platelet Volume) 04/04/2023 9.9  9.4 - 12.4 fl Final   Neutrophils 04/04/2023 2.72  1.50 - 7.80 103/uL Final   Lymphocytes 04/04/2023 1.44  1.00 - 3.60 103/uL Final   Monocytes 04/04/2023 0.52  0.00 - 1.50 103/uL Final   Eosinophils 04/04/2023 0.09  0.00 - 0.55 103/uL Final   Basophils 04/04/2023 0.03  0.00 - 0.09 103/uL Final   Neutrophil % 04/04/2023 56.6  32.0 - 70.0 % Final   Lymphocyte % 04/04/2023 29.9  10.0 - 50.0 % Final   Monocyte % 04/04/2023 10.8  4.0 - 13.0 % Final   Eosinophil % 04/04/2023 1.9  1.0 - 5.0 % Final   Basophil% 04/04/2023 0.6  0.0 - 2.0 % Final   Immature Granulocyte % 04/04/2023 0.2  <=0.7 % Final   Immature Granulocyte Count 04/04/2023 0.01  <=0.06 10^3/L Final   Glucose 04/04/2023 92  70 - 110 mg/dL Final   Sodium 97/81/7974 141  136 - 145 mmol/L Final   Potassium 04/04/2023 4.1  3.6 - 5.1 mmol/L Final   Chloride 04/04/2023 106  97 - 109 mmol/L Final   Carbon Dioxide (CO2) 04/04/2023 25.7  22.0 - 32.0 mmol/L Final   Urea Nitrogen (BUN) 04/04/2023 11  7 - 25 mg/dL Final    Creatinine 97/81/7974 0.9  0.6 - 1.1 mg/dL Final   Glomerular Filtration Rate (eGFR) 04/04/2023 63  >60 mL/min/1.73sq m Final   Calcium 04/04/2023 9.6  8.7 -  10.3 mg/dL Final   AST  97/81/7974 18  8 - 39 U/L Final   ALT  04/04/2023 11  5 - 38 U/L Final   Alk Phos (alkaline Phosphatase) 04/04/2023 47  34 - 104 U/L Final   Albumin 04/04/2023 4.1  3.5 - 4.8 g/dL Final   Bilirubin, Total 04/04/2023 1.0  0.3 - 1.2 mg/dL Final   Protein, Total 04/04/2023 6.1  6.1 - 7.9 g/dL Final   A/G Ratio 97/81/7974 2.1  1.0 - 5.0 gm/dL Final   Hemoglobin J8R 04/04/2023 5.8 (H)  4.2 - 5.6 % Final   Average Blood Glucose (Calc) 04/04/2023 120  mg/dL Final   Cholesterol, Total 04/04/2023 178  100 - 200 mg/dL Final   Triglyceride 97/81/7974 191  35 - 199 mg/dL Final   HDL (High Density Lipoprotein) Cho* 04/04/2023 44.3  35.0 - 85.0 mg/dL Final   LDL Calculated 04/04/2023 96  0 - 130 mg/dL Final   VLDL Cholesterol 04/04/2023 38  mg/dL Final   Cholesterol/HDL Ratio 04/04/2023 4.0   Final   Color 04/04/2023 Yellow  Colorless, Straw, Light Yellow, Yellow, Dark Yellow Final   Clarity 04/04/2023 Clear  Clear Final   Specific Gravity 04/04/2023 1.018  1.005 - 1.030 Final   pH, Urine 04/04/2023 5.5  5.0 - 8.0 Final   Protein, Urinalysis 04/04/2023 Negative  Negative mg/dL Final   Glucose, Urinalysis 04/04/2023 Negative  Negative mg/dL Final   Ketones, Urinalysis 04/04/2023 Negative  Negative mg/dL Final   Blood, Urinalysis 04/04/2023 Negative  Negative Final   Nitrite, Urinalysis 04/04/2023 Negative  Negative Final   Leukocyte Esterase, Urinalysis 04/04/2023 2+ (!)  Negative Final   Bilirubin, Urinalysis 04/04/2023 Negative  Negative Final   Urobilinogen, Urinalysis 04/04/2023 0.2  0.2 - 1.0 mg/dL Final   WBC, UA 97/81/7974 2  <=5 /hpf Final   Red Blood Cells, Urinalysis 04/04/2023 4 (H)  <=3 /hpf Final   Bacteria, Urinalysis 04/04/2023 0-5  0 - 5 /hpf Final   Squamous Epithelial  Cells, Urinaly* 04/04/2023 8  /hpf Final   Thyroid  Stimulating Hormone (TSH) 04/04/2023 1.138  0.450-5.330 uIU/ml uIU/mL Final   Creatinine, Random Urine 04/04/2023 195.4  37.0 - 250.0 mg/dL Final   Urine Albumin, Random 04/04/2023 13    mg/L Final   Urine Albumin/Creatinine Ratio 04/04/2023 6.7  <30.0 ug/mg Final    No follow-ups on file.  Payor: [employer] MEDICARE ADVANTAGE PLANS / Plan: [employer] HMO GOLD PLUS / Product Type: HMO /   This note is partially written by Tyron Koyanagi, in the presence of and acting as the scribe of Dr. Arthea Farrow.   I have reviewed, edited and added to the note as needed to reflect my best personal medical judgment.    Dr. Arthea Farrow, MD [facility] [place], [place] Ph:  [phone] Fax:  [phone]

## 2023-10-17 DIAGNOSIS — R454 Irritability and anger: Secondary | ICD-10-CM | POA: Diagnosis not present

## 2023-10-17 DIAGNOSIS — R413 Other amnesia: Secondary | ICD-10-CM | POA: Diagnosis not present

## 2023-10-17 NOTE — Progress Notes (Signed)
 Today the history is gathered from: 50% - patient  50% - patient's daughter again  RECORDS SUMMARY: I have reviewed the note dated 05/30/2023 from Dr. Ophelia Sage who has indicated:  Memory disorder  Given these abnormal neurologic findings, a referral to neurology has been recommended.  REFERRING PHYSICIAN: Lane Arthea Locus, MD PRIMARY CARE PHYSICIAN:  Sage Ophelia Marinell DOUGLAS, MD   IMPRESSION/PLAN  Cassandra Carroll is a 83 y.o. female presenting for evaluation of  MEMORY IMPAIRMENT/ IRRITABILITY - Worsening.  - Patient is reporting with worsening short memory. No difficulty with long term memory. Sleep is good, notes that she sleep talks. Has irritability, mood swings, crying spells, and frustration. Taking Aricept 10 mg nightly and Aspirin 81 mg as prescribed by other provider. - Memory evaluation today is 27/30. Will repeat in 6 months.  - Continue taking Aricept 10 mg nightly for memory loss. 90 day supply, refill.  - Continue taking Abilify 10 mg as prescribed by other provider for mood. - Continue taking Citalopram 20 mg as prescribed by other provider for mood. - Driving recommendations are as follows: Short distances, local routes, no night driving.  Recommend having another passenger in the car at all times.  - Symptoms most consistent with mild cognitive impairment (MCI) at this time. Will continue to monitor at future visits.   Medications previously tried:  Follow-up with Dr. Lane in 6 months.  p=4   CHIEF COMPLAINT & HPI  Cassandra Carroll is a 83 y.o. female presenting for evaluation of: Chief Complaint  Patient presents with   MEMORY IMPAIRMENT/ IRRITABILITY    MEMORY IMPAIRMENT/ IRRITABILITY Patient is reporting with worsening short memory. No difficulty with long term memory. Reports difficulty recalling words, repeating questions, and conversations. Denies misplacing items. Denies difficulty using complex tools and devices. Denies forgetting familiar names and faces.  She lives by herself and her dog. No sundowning or nighttime wandering. Patient currently does not drive. Sleep is good, notes that she sleep talks. Patients daughter has stated patient has increased irritability, mood swings, crying spells, and frustration. Taking Aricept 10 mg nightly and Aspirin 81 mg as prescribed by other provider. Memory evaluation today is 27/30.  DATA SUMMARY: 05/03/2023 CT HEAD WO IMPRESSION:  1. No acute intracranial abnormality.  2. Mild cerebral volume loss and moderately progressed cerebral  white matter disease since 2010.   03/10/2008 MRI BRAIN W WO IMPRESSION:  No acute or reversible process.  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  This is not acute or subacute however.   03/03/2008 CT HEAD WO IMPRESSION:  1. No evidence of acute intracranial hemorrhage.  2.  New white matter hypodensity in the left frontal lobe.  This  represents an age indeterminate infarction.  No CT evidence of  cortical infarction.  Consider MRI for further evaluation if  concern for acute stroke.   05/09/2006 CT HEAD WO IMPRESSION:  There are some microvascular white matter changes, but no acute infarct or bleed.   VISIT SUMMARIES:   MEDICATIONS Current Outpatient Medications  Medication Sig Dispense Refill   alendronate (FOSAMAX) 70 MG tablet Take 1 tablet (70 mg total) by mouth every 7 (seven) days Take with a full glass of water. Do not lie down for the next 30 min. 4 tablet 11   ARIPiprazole (ABILIFY) 10 MG tablet Take 1 tablet (10 mg total) by mouth at bedtime 30 tablet  11   aspirin 81 MG EC tablet Take 81 mg by mouth once daily     citalopram (CELEXA) 20 MG tablet Take 1 tablet by mouth once daily 90 tablet 1   COQ10, UBIQUINOL, ORAL Take by mouth once daily.     donepeziL (ARICEPT) 10 MG tablet Take 1 tablet (10 mg total) by mouth at  bedtime 30 tablet 3   hydroCHLOROthiazide (HYDRODIURIL) 12.5 MG tablet Take 1 tablet (12.5 mg total) by mouth once daily 90 tablet 1   levothyroxine (SYNTHROID) 88 MCG tablet Take 1 tablet by mouth once daily 90 tablet 1   losartan (COZAAR) 100 MG tablet Take 1 tablet by mouth once daily 90 tablet 1   MULTIVIT-MIN/FA/LYCOPENE/LUT (SENTRY SENIOR ORAL) Take by mouth once daily.     pantoprazole (PROTONIX) 40 MG DR tablet Take 1 tablet by mouth once daily 90 tablet 1   simvastatin (ZOCOR) 20 MG tablet TAKE 1 TABLET BY MOUTH AT BEDTIME 90 tablet 1   donepeziL (ARICEPT) 5 MG tablet Take 1 tablet (5 mg total) by mouth at bedtime (Patient not taking: Reported on 10/17/2023) 30 tablet 3   L.ACID/L.CASEI/B.BIF/B.LON/FOS (PROBIOTIC BLEND ORAL) Take by mouth once daily (Patient not taking: Reported on 10/17/2023)     No current facility-administered medications for this visit.    ALLERGIES No Known Allergies   EXAM   Vitals:   10/17/23 0853  BP: (!) 157/78  Pulse: 62  Weight: 72.1 kg (159 lb)  Height: 162.6 cm (5' 4)  PainSc: 0-No pain    Body mass index is 27.29 kg/m.  MEMORY EVALUATION: 10/17/2023 - 27/30 06/06/2023 - 27/30  GENERAL: Pleasant elderly female. In no acute distress.  Normocephalic and atraumatic.  MUSCULOSKELETAL: Bulk - Normal Tone - Normal Pronator Drift - Absent bilaterally. Ambulation - Gait and station are steady. Romberg - Mildly positive  R/L 5/5    Shoulder abduction (deltoid/supraspinatus, axillary/suprascapular n, C5) 5/5    Elbow flexion (biceps brachii, musculoskeletal n, C5-6) 5/5    Elbow extension (triceps, radial n, C7) 5/5    Finger adduction (interossei, ulnar n, T1)  4/4    Hip flexion (iliopsoas, L1/L2) 4/4    Knee flexion (hamstrings, sciatic n, L5/S1)  4/4    Knee extension (quadriceps, femoral n, L3/4) 4/4    Ankle dorsiflexion (tibialis anterior, deep fibular n, L4/5) 4/4    Ankle plantarflexion (gastroc, tibial n, S1)     NEUROLOGICAL: MENTAL STATUS: Patient is oriented to person, place and time.  Recent memory is mildly reduced.  Remote memory is intact.  Attention span and concentration are intact.  Naming, repetition, comprehension and expressive speech are within normal limits.  Patient's fund of knowledge is within normal limits for educational level.  CRANIAL NERVES: Normal    CN II (normal visual acuity and visual fields) Normal    CN III, IV, VI (extraocular muscles are intact) Normal    CN V (facial sensation is intact bilaterally) Normal    CN VII (facial strength is intact bilaterally) Normal    CN VIII (hearing is intact bilaterally) Normal    CN IX/X (palate elevates midline, normal phonation) Normal    CN XI (shoulder shrug strength is normal and symmetric) Normal    CN XII (tongue protrudes midline)  SENSATION: Intact to pain and temp bilaterally (spinothalamic tracts) Intact to position and vibration bilaterally (dorsal columns)  REFLEXES: R/L 1+/1+    Biceps 1+/1+    Brachioradialis  1+/1+    Patellar 1+/1+  Achilles   COORDINATION/CEREBELLAR: Finger to nose testing is within normal limits.     PAST MEDICAL HISTORY Past Medical History:  Diagnosis Date   Benign hypertension    Depression    GERD (gastroesophageal reflux disease)    Hyperlipidemia    Hypothyroidism     PAST SURGICAL HISTORY Past Surgical History:  Procedure Laterality Date   FUSION ARTHRODESIS ANKLE W/ARTHROSCOPY Right 1965   COLONOSCOPY  03/11/2004   entire examined colon was normal   COLONOSCOPY  03/10/2014   Normal/No Repeat/PYO   PHOTOREFRACTIVE KERATOTOMY/LASIK Bilateral     FAMILY HISTORY Family History  Problem Relation Name Age of Onset   Stroke Mother     Ulcers Father     Lung cancer Brother      SOCIAL HISTORY  Social History   Tobacco Use   Smoking status: Former    Types: Cigarettes   Smokeless tobacco: Never  Vaping Use   Vaping status: Never Used   Substance Use Topics   Alcohol  use: Not Currently   Drug use: Never     REVIEW OF SYSTEMS:  13 system ROS form was given to the patient to complete and I have reviewed it.  The form was sent for scan to the patient's EHR.  Pertinent positives and negatives are mentioned above in the HPI and all other systems are negative.   DATA  I have personally reviewed all of the data outlined below both prior to the appointment and during the appointment with the patient as appropriate.  Office Visit on 04/25/2023  Component Date Value Ref Range Status   Vitamin B12 04/25/2023 156 (L)  >300 pg/mL Final   RPR - [facility] 04/25/2023 Non Reactive  Non Reactive Final   HIV 1/2 Antibodies 04/25/2023 Negative  Negative Final   HIV 1 P24 Antigen 04/25/2023 Negative  Negative Final    No follow-ups on file.  Payor: [employer] MEDICARE ADVANTAGE PLANS / Plan: [employer] HMO GOLD PLUS / Product Type: HMO /  This note is partially written by Sarah Almashhadani, in the presence of and acting as the scribe of Dr. Arthea Farrow.     I have reviewed, edited and added to the note as needed to reflect my best personal medical judgment.    Dr. Arthea Farrow, MD [facility] [place], [place] Ph:  [phone] Fax:  [phone]

## 2024-02-10 ENCOUNTER — Emergency Department
Admission: EM | Admit: 2024-02-10 | Discharge: 2024-02-10 | Disposition: A | Discharge status: Home / Self Care | Attending: Emergency Medicine | Admitting: Emergency Medicine

## 2024-02-10 DIAGNOSIS — I10 Essential (primary) hypertension: Secondary | ICD-10-CM | POA: Diagnosis not present

## 2024-02-10 DIAGNOSIS — J101 Influenza due to other identified influenza virus with other respiratory manifestations: Secondary | ICD-10-CM | POA: Insufficient documentation

## 2024-02-10 DIAGNOSIS — R059 Cough, unspecified: Secondary | ICD-10-CM | POA: Diagnosis present

## 2024-02-10 DIAGNOSIS — E039 Hypothyroidism, unspecified: Secondary | ICD-10-CM | POA: Diagnosis not present

## 2024-02-10 LAB — RESP PANEL BY RT-PCR (RSV, FLU A&B, COVID)  RVPGX2
Influenza A by PCR: POSITIVE — AB
Influenza B by PCR: NEGATIVE
Resp Syncytial Virus by PCR: NEGATIVE
SARS Coronavirus 2 by RT PCR: NEGATIVE

## 2024-02-10 MED ORDER — ONDANSETRON 4 MG PO TBDP: 4.0000 mg | ORAL_TABLET | Freq: Three times a day (TID) | ORAL | 0 refills | Status: AC | PRN

## 2024-02-10 MED ORDER — GUAIFENESIN 200 MG PO TABS: 400.0000 mg | ORAL_TABLET | Freq: Four times a day (QID) | ORAL | 0 refills | Status: AC | PRN

## 2024-02-10 MED ORDER — BENZONATATE 100 MG PO CAPS: 100.0000 mg | ORAL_CAPSULE | Freq: Three times a day (TID) | ORAL | 0 refills | Status: AC | PRN

## 2024-02-10 NOTE — Discharge Instructions (Signed)
 Take Tylenol 500 mg every 4 hours or 650 mg every 6 hours as needed for body aches or fever.  Take the Tessalon  Perles and/or the guaifenesin  as needed for cough.  Use the Zofran  as needed for nausea.  Follow-up with your primary care provider.  Return to the ER for new, worsening, or persistent severe cough, shortness of breath, weakness, fever, vomiting, or any other new or worsening symptoms that concern you.

## 2024-02-10 NOTE — ED Provider Notes (Signed)
 "  [facility] Provider Note    Event Date/Time   First MD Initiated Contact with Patient 02/10/24 1545     (approximate)   History   Generalized Body Aches (/)   HPI  Cassandra Carroll is a 83 y.o. female with a history of hypertension, hyperlipidemia, hypothyroidism, and depression who presents with memory impairment who presents with gout-like symptoms for the last 5 days.  Specifically she reports a nonproductive cough, generalized bodyaches, subjective fevers, and some nausea and decreased appetite.  She reports feeling generally weak.  She denies any vomiting or diarrhea.  She has no shortness of breath.  She has no chest pain.  She is able to tolerate p.o.  I reviewed the past medical records.  The patient's most recent outpatient encounter was with Dr. Lane from neurology on 9/2 for evaluation of memory impairment.   Physical Exam   Triage Vital Signs: ED Triage Vitals [02/10/24 1336]  Encounter Vitals Group     BP 119/74     Girls Systolic BP Percentile      Girls Diastolic BP Percentile      Boys Systolic BP Percentile      Boys Diastolic BP Percentile      Pulse Rate 61     Resp 20     Temp 98.4 F (36.9 C)     Temp Source Oral     SpO2 98 %     Weight      Height      Head Circumference      Peak Flow      Pain Score      Pain Loc      Pain Education      Exclude from Growth Chart     Most recent vital signs: Vitals:   02/10/24 1336  BP: 119/74  Pulse: 61  Resp: 20  Temp: 98.4 F (36.9 C)  SpO2: 98%     General: Alert, well-appearing for age, no distress.  CV:  Good peripheral perfusion.  Resp:  Normal effort.  Lungs CTAB. Abd:  No distention.  Other:  Slightly dry mucous membranes.   ED Results / Procedures / Treatments   Labs (all labs ordered are listed, but only abnormal results are displayed) Labs Reviewed  RESP PANEL BY RT-PCR (RSV, FLU A&B, COVID)  RVPGX2 - Abnormal; Notable for the following components:       Result Value   Influenza A by PCR POSITIVE (*)    All other components within normal limits     EKG     RADIOLOGY    PROCEDURES:  Critical Care performed: No  Procedures   MEDICATIONS ORDERED IN ED: Medications - No data to display   IMPRESSION / MDM / ASSESSMENT AND PLAN / ED COURSE  I reviewed the triage vital signs and the nursing notes.   Differential diagnosis includes, but is not limited to, influenza, COVID or other viral syndrome.  Patient's presentation is most consistent with acute complicated illness / injury requiring diagnostic workup.  Respiratory panel is positive for influenza A.  Given that the patient has no shortness of breath or hypoxia there is no indication for chest x-ray.  She has normal vital signs and overall appears well.  There is no specific indication for lab workup or further fluids.  The patient feels comfortable and would like to go home.  She is out of the window for treatment with Tamiflu.  She is stable for discharge at  this time.  I gave strict return precautions, and she expressed understanding.   FINAL CLINICAL IMPRESSION(S) / ED DIAGNOSES   Final diagnoses:  Influenza A     Rx / DC Orders   ED Discharge Orders          Ordered    ondansetron  (ZOFRAN -ODT) 4 MG disintegrating tablet  Every 8 hours PRN        02/10/24 1631    guaiFENesin  200 MG tablet  Every 6 hours PRN        02/10/24 1631    benzonatate  (TESSALON ) 100 MG capsule  3 times daily PRN        02/10/24 1631             Note:  This document was prepared using Dragon voice recognition software and may include unintentional dictation errors.    Jacolyn Pae, MD 02/10/24 1803  "

## 2024-02-10 NOTE — ED Triage Notes (Signed)
 Pt to ED via POV from home. Pt reports generalized body aches, cough, fatigue and possible fever. Unsure of sick contacts.
